# Patient Record
Sex: Male | Born: 1945 | Race: White | Hispanic: No | Marital: Single | Smoking: Current every day smoker
Health system: Southern US, Community
[De-identification: ages and names within clinical notes are randomized; demographics above are authoritative.]

## PROBLEM LIST (undated history)

## (undated) DIAGNOSIS — R42 Dizziness and giddiness: Secondary | ICD-10-CM

## (undated) DIAGNOSIS — I6529 Occlusion and stenosis of unspecified carotid artery: Secondary | ICD-10-CM

## (undated) DIAGNOSIS — E7800 Pure hypercholesterolemia, unspecified: Secondary | ICD-10-CM

## (undated) DIAGNOSIS — I10 Essential (primary) hypertension: Secondary | ICD-10-CM

## (undated) DIAGNOSIS — I639 Cerebral infarction, unspecified: Secondary | ICD-10-CM

## (undated) HISTORY — DX: Occlusion and stenosis of unspecified carotid artery: I65.29

## (undated) HISTORY — DX: Cerebral infarction, unspecified: I63.9

---

## 1997-09-08 ENCOUNTER — Encounter: Admission: RE | Admit: 1997-09-08 | Discharge: 1997-09-08 | Payer: Self-pay | Admitting: Sports Medicine

## 1997-09-10 ENCOUNTER — Encounter: Admission: RE | Admit: 1997-09-10 | Discharge: 1997-09-10 | Payer: Self-pay | Admitting: Family Medicine

## 1997-12-08 ENCOUNTER — Encounter: Admission: RE | Admit: 1997-12-08 | Discharge: 1997-12-08 | Payer: Self-pay | Admitting: Family Medicine

## 1997-12-24 ENCOUNTER — Encounter: Admission: RE | Admit: 1997-12-24 | Discharge: 1997-12-24 | Payer: Self-pay | Admitting: Family Medicine

## 1998-01-05 ENCOUNTER — Encounter: Admission: RE | Admit: 1998-01-05 | Discharge: 1998-01-05 | Payer: Self-pay | Admitting: Family Medicine

## 1998-02-21 ENCOUNTER — Encounter: Admission: RE | Admit: 1998-02-21 | Discharge: 1998-02-21 | Payer: Self-pay | Admitting: Family Medicine

## 1998-04-07 ENCOUNTER — Encounter: Admission: RE | Admit: 1998-04-07 | Discharge: 1998-04-07 | Payer: Self-pay | Admitting: Family Medicine

## 1998-10-03 ENCOUNTER — Ambulatory Visit (HOSPITAL_COMMUNITY): Admission: RE | Admit: 1998-10-03 | Discharge: 1998-10-03 | Payer: Self-pay | Admitting: *Deleted

## 2000-10-25 ENCOUNTER — Ambulatory Visit (HOSPITAL_COMMUNITY): Admission: RE | Admit: 2000-10-25 | Discharge: 2000-10-25 | Payer: Self-pay | Admitting: Endocrinology

## 2000-10-26 ENCOUNTER — Encounter: Payer: Self-pay | Admitting: Internal Medicine

## 2003-06-05 DIAGNOSIS — I639 Cerebral infarction, unspecified: Secondary | ICD-10-CM

## 2003-06-05 HISTORY — PX: CAROTID ENDARTERECTOMY: SUR193

## 2003-06-05 HISTORY — DX: Cerebral infarction, unspecified: I63.9

## 2004-04-24 ENCOUNTER — Emergency Department (HOSPITAL_COMMUNITY): Admission: EM | Admit: 2004-04-24 | Discharge: 2004-04-24 | Payer: Self-pay | Admitting: Emergency Medicine

## 2007-06-19 ENCOUNTER — Emergency Department (HOSPITAL_COMMUNITY): Admission: EM | Admit: 2007-06-19 | Discharge: 2007-06-19 | Payer: Self-pay | Admitting: Emergency Medicine

## 2008-05-29 ENCOUNTER — Inpatient Hospital Stay: Payer: Self-pay | Admitting: Internal Medicine

## 2008-06-09 ENCOUNTER — Encounter (HOSPITAL_COMMUNITY): Admission: RE | Admit: 2008-06-09 | Discharge: 2008-07-09 | Payer: Self-pay | Admitting: Internal Medicine

## 2008-06-24 ENCOUNTER — Ambulatory Visit: Payer: Self-pay | Admitting: Vascular Surgery

## 2008-07-20 ENCOUNTER — Ambulatory Visit: Payer: Self-pay | Admitting: Vascular Surgery

## 2008-07-30 ENCOUNTER — Encounter: Payer: Self-pay | Admitting: Vascular Surgery

## 2008-07-30 ENCOUNTER — Ambulatory Visit: Payer: Self-pay | Admitting: Vascular Surgery

## 2008-07-30 ENCOUNTER — Inpatient Hospital Stay (HOSPITAL_COMMUNITY): Admission: RE | Admit: 2008-07-30 | Discharge: 2008-07-31 | Payer: Self-pay | Admitting: Vascular Surgery

## 2008-07-30 HISTORY — PX: CAROTID ENDARTERECTOMY: SUR193

## 2008-08-17 ENCOUNTER — Ambulatory Visit: Payer: Self-pay | Admitting: Vascular Surgery

## 2009-03-22 ENCOUNTER — Ambulatory Visit: Payer: Self-pay | Admitting: Vascular Surgery

## 2009-09-13 ENCOUNTER — Ambulatory Visit: Payer: Self-pay | Admitting: Vascular Surgery

## 2009-09-28 ENCOUNTER — Emergency Department (HOSPITAL_COMMUNITY): Admission: EM | Admit: 2009-09-28 | Discharge: 2009-09-28 | Payer: Self-pay | Admitting: Emergency Medicine

## 2010-08-22 LAB — DIFFERENTIAL
Basophils Absolute: 0 10*3/uL (ref 0.0–0.1)
Basophils Relative: 0 % (ref 0–1)
Eosinophils Absolute: 0.1 10*3/uL (ref 0.0–0.7)
Eosinophils Relative: 1 % (ref 0–5)
Monocytes Absolute: 0.8 10*3/uL (ref 0.1–1.0)

## 2010-08-22 LAB — CBC
HCT: 39.5 % (ref 39.0–52.0)
Hemoglobin: 13.9 g/dL (ref 13.0–17.0)
MCHC: 35.3 g/dL (ref 30.0–36.0)
MCV: 94.1 fL (ref 78.0–100.0)
RDW: 14.4 % (ref 11.5–15.5)

## 2010-08-22 LAB — BASIC METABOLIC PANEL
CO2: 26 mEq/L (ref 19–32)
Calcium: 8.9 mg/dL (ref 8.4–10.5)
Glucose, Bld: 117 mg/dL — ABNORMAL HIGH (ref 70–99)
Potassium: 3.8 mEq/L (ref 3.5–5.1)
Sodium: 137 mEq/L (ref 135–145)

## 2010-08-22 LAB — POCT CARDIAC MARKERS
CKMB, poc: 1.1 ng/mL (ref 1.0–8.0)
Troponin i, poc: <0.05 ng/mL (ref 0.00–0.09)

## 2010-08-22 LAB — CK TOTAL AND CKMB (NOT AT ARMC)
CK, MB: 1.6 ng/mL (ref 0.3–4.0)
Total CK: 41 U/L (ref 7–232)

## 2010-08-27 ENCOUNTER — Emergency Department (HOSPITAL_COMMUNITY)
Admission: EM | Admit: 2010-08-27 | Discharge: 2010-08-27 | Disposition: A | Discharge status: Home / Self Care | Payer: Medicare Other | Attending: Emergency Medicine | Admitting: Emergency Medicine

## 2010-08-27 ENCOUNTER — Emergency Department (HOSPITAL_COMMUNITY): Payer: Medicare Other

## 2010-08-27 DIAGNOSIS — I10 Essential (primary) hypertension: Secondary | ICD-10-CM | POA: Insufficient documentation

## 2010-08-27 DIAGNOSIS — R42 Dizziness and giddiness: Secondary | ICD-10-CM | POA: Insufficient documentation

## 2010-08-27 DIAGNOSIS — R51 Headache: Secondary | ICD-10-CM | POA: Insufficient documentation

## 2010-08-27 LAB — BASIC METABOLIC PANEL
Calcium: 9.1 mg/dL (ref 8.4–10.5)
Creatinine, Ser: 1.21 mg/dL (ref 0.4–1.5)
GFR calc non Af Amer: >60 mL/min (ref >60)
Glucose, Bld: 100 mg/dL — ABNORMAL HIGH (ref 70–99)
Sodium: 141 mEq/L (ref 135–145)

## 2010-08-27 LAB — DIFFERENTIAL
Basophils Absolute: 0 10*3/uL (ref 0.0–0.1)
Basophils Relative: 0 % (ref 0–1)
Eosinophils Relative: 1 % (ref 0–5)
Lymphocytes Relative: 22 % (ref 12–46)
Monocytes Absolute: 0.6 10*3/uL (ref 0.1–1.0)

## 2010-08-27 LAB — CBC
HCT: 43.4 % (ref 39.0–52.0)
MCH: 32.2 pg (ref 26.0–34.0)
MCHC: 33.6 g/dL (ref 30.0–36.0)
RDW: 14 % (ref 11.5–15.5)

## 2010-09-04 ENCOUNTER — Emergency Department (HOSPITAL_COMMUNITY)
Admission: EM | Admit: 2010-09-04 | Discharge: 2010-09-04 | Discharge status: Against Medical Advice | Payer: Medicare Other | Attending: Emergency Medicine | Admitting: Emergency Medicine

## 2010-09-05 ENCOUNTER — Emergency Department (HOSPITAL_COMMUNITY)
Admission: EM | Admit: 2010-09-05 | Discharge: 2010-09-05 | Disposition: A | Discharge status: Home / Self Care | Payer: Medicare Other | Attending: Emergency Medicine | Admitting: Emergency Medicine

## 2010-09-05 DIAGNOSIS — Z8673 Personal history of transient ischemic attack (TIA), and cerebral infarction without residual deficits: Secondary | ICD-10-CM | POA: Insufficient documentation

## 2010-09-05 DIAGNOSIS — I10 Essential (primary) hypertension: Secondary | ICD-10-CM | POA: Insufficient documentation

## 2010-09-05 DIAGNOSIS — S90569A Insect bite (nonvenomous), unspecified ankle, initial encounter: Secondary | ICD-10-CM | POA: Insufficient documentation

## 2010-09-05 DIAGNOSIS — Z79899 Other long term (current) drug therapy: Secondary | ICD-10-CM | POA: Insufficient documentation

## 2010-09-05 DIAGNOSIS — W57XXXA Bitten or stung by nonvenomous insect and other nonvenomous arthropods, initial encounter: Secondary | ICD-10-CM | POA: Insufficient documentation

## 2010-09-19 LAB — CBC
HCT: 39.2 % (ref 39.0–52.0)
Hemoglobin: 13.7 g/dL (ref 13.0–17.0)
Hemoglobin: 16.1 g/dL (ref 13.0–17.0)
MCHC: 35 g/dL (ref 30.0–36.0)
MCV: 94.3 fL (ref 78.0–100.0)
Platelets: 150 K/uL (ref 150–400)
Platelets: 193 10*3/uL (ref 150–400)
RBC: 4.16 MIL/uL — ABNORMAL LOW (ref 4.22–5.81)
RDW: 13.3 % (ref 11.5–15.5)
RDW: 14 % (ref 11.5–15.5)
WBC: 11.2 K/uL — ABNORMAL HIGH (ref 4.0–10.5)

## 2010-09-19 LAB — BASIC METABOLIC PANEL WITH GFR
BUN: 12 mg/dL (ref 6–23)
CO2: 26 meq/L (ref 19–32)
Calcium: 8.3 mg/dL — ABNORMAL LOW (ref 8.4–10.5)
Chloride: 103 meq/L (ref 96–112)
Creatinine, Ser: 0.95 mg/dL (ref 0.4–1.5)
GFR calc non Af Amer: >60 mL/min
Glucose, Bld: 112 mg/dL — ABNORMAL HIGH (ref 70–99)
Potassium: 3.7 meq/L (ref 3.5–5.1)
Sodium: 133 meq/L — ABNORMAL LOW (ref 135–145)

## 2010-09-19 LAB — URINALYSIS, ROUTINE W REFLEX MICROSCOPIC
Glucose, UA: NEGATIVE mg/dL
Hgb urine dipstick: NEGATIVE
Specific Gravity, Urine: 1.01 (ref 1.005–1.030)
pH: 5.5 (ref 5.0–8.0)

## 2010-09-19 LAB — COMPREHENSIVE METABOLIC PANEL WITH GFR
ALT: 18 U/L (ref 0–53)
AST: 17 U/L (ref 0–37)
Albumin: 3.6 g/dL (ref 3.5–5.2)
Alkaline Phosphatase: 81 U/L (ref 39–117)
BUN: 10 mg/dL (ref 6–23)
CO2: 28 meq/L (ref 19–32)
Calcium: 9.1 mg/dL (ref 8.4–10.5)
Chloride: 104 meq/L (ref 96–112)
Creatinine, Ser: 0.87 mg/dL (ref 0.4–1.5)
GFR calc non Af Amer: >60 mL/min
Glucose, Bld: 113 mg/dL — ABNORMAL HIGH (ref 70–99)
Potassium: 4.6 meq/L (ref 3.5–5.1)
Sodium: 137 meq/L (ref 135–145)
Total Bilirubin: 0.7 mg/dL (ref 0.3–1.2)
Total Protein: 6.3 g/dL (ref 6.0–8.3)

## 2010-09-19 LAB — CROSSMATCH
ABO/RH(D): O POS
Antibody Screen: NEGATIVE

## 2010-09-19 LAB — ABO/RH: ABO/RH(D): O POS

## 2010-09-20 ENCOUNTER — Emergency Department (HOSPITAL_COMMUNITY)
Admission: EM | Admit: 2010-09-20 | Discharge: 2010-09-20 | Disposition: A | Discharge status: Home / Self Care | Payer: Medicare Other | Attending: Emergency Medicine | Admitting: Emergency Medicine

## 2010-09-20 DIAGNOSIS — Z79899 Other long term (current) drug therapy: Secondary | ICD-10-CM | POA: Insufficient documentation

## 2010-09-20 DIAGNOSIS — Z8673 Personal history of transient ischemic attack (TIA), and cerebral infarction without residual deficits: Secondary | ICD-10-CM | POA: Insufficient documentation

## 2010-09-20 DIAGNOSIS — Z7901 Long term (current) use of anticoagulants: Secondary | ICD-10-CM | POA: Insufficient documentation

## 2010-09-20 DIAGNOSIS — R21 Rash and other nonspecific skin eruption: Secondary | ICD-10-CM | POA: Insufficient documentation

## 2010-09-20 DIAGNOSIS — I10 Essential (primary) hypertension: Secondary | ICD-10-CM | POA: Insufficient documentation

## 2010-09-23 ENCOUNTER — Emergency Department (HOSPITAL_COMMUNITY)
Admission: EM | Admit: 2010-09-23 | Discharge: 2010-09-23 | Disposition: A | Discharge status: Home / Self Care | Payer: Medicare Other | Attending: Emergency Medicine | Admitting: Emergency Medicine

## 2010-09-23 DIAGNOSIS — I10 Essential (primary) hypertension: Secondary | ICD-10-CM | POA: Insufficient documentation

## 2010-09-23 DIAGNOSIS — L259 Unspecified contact dermatitis, unspecified cause: Secondary | ICD-10-CM | POA: Insufficient documentation

## 2010-09-23 DIAGNOSIS — Z8673 Personal history of transient ischemic attack (TIA), and cerebral infarction without residual deficits: Secondary | ICD-10-CM | POA: Insufficient documentation

## 2010-09-23 DIAGNOSIS — Z7901 Long term (current) use of anticoagulants: Secondary | ICD-10-CM | POA: Insufficient documentation

## 2010-09-26 ENCOUNTER — Other Ambulatory Visit: Payer: Self-pay

## 2010-10-17 NOTE — Procedures (Signed)
CAROTID DUPLEX EXAM   INDICATION:  Follow up carotid artery disease.   HISTORY:  Diabetes:  No.  Cardiac:  No.  Hypertension:  Yes.  Smoking:  No.  Previous Surgery:  Left carotid endarterectomy on 07/30/2008.  CV History:  CVA in 2005.  Amaurosis Fugax No, Paresthesias No, Hemiparesis No.                                       RIGHT             LEFT  Brachial systolic pressure:         100               96  Brachial Doppler waveforms:         WNL               WNL  Vertebral direction of flow:        Antegrade         Antegrade  DUPLEX VELOCITIES (cm/sec)  CCA peak systolic                   94                98  ECA peak systolic                   131               89  ICA peak systolic                   78                82  ICA end diastolic                   26                33  PLAQUE MORPHOLOGY:                  Heterogenous  PLAQUE AMOUNT:                      Mild  PLAQUE LOCATION:                    ICA   IMPRESSION:  1. Right internal carotid artery suggests 20% to 39% stenosis.  2. Left internal carotid artery appears patent, status post left      carotid endarterectomy with no stenosis noted.  3. Antegrade flow in bilateral vertebrals.   ___________________________________________  Quita Skye Hart Rochester, M.D.   CB/MEDQ  D:  09/13/2009  T:  09/14/2009  Job:  [id number]

## 2010-10-17 NOTE — Procedures (Signed)
CAROTID DUPLEX EXAM   INDICATION:  Follow up carotid artery disease, near-occlusion, left ICA,  per CT.   HISTORY:  Diabetes:  No.  Cardiac:  No.  Hypertension:  Yes.  Smoking:  Yes.  Previous Surgery:  CV History:  CVA in 12/09.  Amaurosis Fugax Yes, Paresthesias Yes, Hemiparesis Yes.                                       RIGHT             LEFT  Brachial systolic pressure:         116               118  Brachial Doppler waveforms:         WNL               WNL  Vertebral direction of flow:        Antegrade         Antegrade  DUPLEX VELOCITIES (cm/sec)  CCA peak systolic                   100               80  ECA peak systolic                   181               135  ICA peak systolic                   85                562  ICA end diastolic                   26                187  PLAQUE MORPHOLOGY:                  Calcified         Heterogenous  PLAQUE AMOUNT:                      Mild              Severe  PLAQUE LOCATION:                    ICA/ECA  ICA/ECA/Bifurcation   IMPRESSION:  1. Right internal carotid artery shows evidence of 20-39% stenosis.  2. Left internal carotid artery shows evidence of 80-99% stenosis.  3. Right external carotid artery stenosis.   ___________________________________________  Quita Skye Hart Rochester, M.D.   AS/MEDQ  D:  07/20/2008  T:  07/20/2008  Job:  [id number]

## 2010-10-17 NOTE — Assessment & Plan Note (Signed)
OFFICE VISIT   Frederick [name], Frederick [name]  DOB:  13-Jun-1945                                       08/17/2008  UEAVW#:[id number]   The patient returns for initial followup following left carotid  endarterectomy done February 26 for severe left internal carotid  stenosis.  He suffered a left brain stroke in December of 2009 with some  right-sided weakness and aphasia most of which are resolved.  He  continues to do well following the surgery with no neurologic complaints  or complications.  He continues to have some mild clumsiness in the  right side and his speech is slightly slow but fairly clear.  He  continues to take Coumadin 2.5 mg per day followed by Dr. Concepcion Elk.   PHYSICAL EXAMINATION:  On examination blood pressure 105/70, heart rate  60, respirations 14.  Left neck incision has healed nicely.  Carotid  pulses 3+ with no audible bruit.  Neurologic exam is as described above  with very mild clumsiness on the right side.   In general I think he is doing well.  He will continue to increase his  activity as tolerated and return to see me in 6 months with followup  carotid duplex exam at that time unless he develops any new neurologic  symptoms in the interim.   Quita Skye Hart Rochester, M.D.  Electronically Signed   JDL/MEDQ  D:  08/17/2008  T:  08/19/2008  Job:  [id number]   cc:   Fleet Contras, M.D.

## 2010-10-17 NOTE — Op Note (Signed)
NAME:  Frederick [name], Frederick [name]                  ACCOUNT NO.:  [id number]   MEDICAL RECORD NO.:  [id number]          PATIENT TYPE:  INP   LOCATION:  3314                         FACILITY:  [facility]   PHYSICIAN:  Quita Skye. Hart Rochester, M.D.  DATE OF BIRTH:  1946-04-06   DATE OF PROCEDURE:  07/30/2008  DATE OF DISCHARGE:                               OPERATIVE REPORT   PREOPERATIVE DIAGNOSIS:  Severe left internal carotid stenosis, status  post left brain cerebrovascular accident.   POSTOPERATIVE DIAGNOSIS:  Severe left internal carotid stenosis, status  post left brain cerebrovascular accident.   OPERATION:  Left carotid endarterectomy with Dacron patch angioplasty.   SURGEON:  Quita Skye. Hart Rochester, MD   FIRST ASSISTANT:  Nurse.   ANESTHESIA:  General endotracheal.   BRIEF HISTORY:  This patient suffered a left brain stroke in December  2009, consisting of a right-sided weakness and aphasia.  This is almost  completely resolved with the exception of some mild clumsiness on the  right side, and it was found to have a 95% focal left internal carotid  stenosis by CT angiogram, confirmed by duplex scanning.  He has mild  disease on the contralateral right side, and scheduled now for left  carotid endarterectomy.   PROCEDURE:  The patient was taken to the operating room and placed in  the supine position at which time satisfactory general endotracheal  anesthesia was administered.  Left neck was prepped with Betadine and  scrub solution and draped in routine sterile manner.  Incision was made  along the anterior border of the sternocleidomastoid muscle and carried  down through subcutaneous tissue and platysma using the Bovie.  Common  facial vein and external jugular veins were ligated with 3-0 silk ties  and divided exposing the common internal and external carotid arteries.  Care was taken not to injure the vagus or hypoglossal nerves, both of  which were exposed.  There was calcified atherosclerotic  plaque at the  carotid bifurcation extending up the internal carotid artery about 3-4  cm up to the crossing of the hypoglossal nerve, which was mobilized for  exposure.  A care was taken not to injure the nerve during this  exposure.  Common carotid artery was relatively small vessel, but  relatively free of disease.  A #10 shunt was prepared, and the patient  was heparinized.  Carotid vessels were occluded with vascular clamps.  Longitudinal opening made in the common carotid with 15 blade and  extended up the internal carotid with Potts scissors to a point distal  to the disease.  Plaque was severely stenotic at least 90-95% with good  backbleeding coming from the distal vessel.  A #10 shunt was inserted  without difficulty reestablishing flow in about 2 minutes.  Standard  endarterectomy was then performed using elevator and Potts scissors with  an eversion endarterectomy of the external carotid.  The plaque  feathered off distal internal carotid artery nicely, not requiring any  tacking sutures.  Lumen was thoroughly irrigated with heparin saline.  All loose debris carefully removed.  Arteriotomy was closed with a patch  using continuous 6-0 Prolene.  Prior to completion of the closure, shunt  was removed and after 30 minutes of shunt time following antegrade and  retrograde flushing, closure was completed, reestablishing the flow  initially up the external and up the internal branch.  Carotid was  occluded for less than 2 minutes removal of the  shunt.  Protamine was then given to reverse the heparin following  adequate hemostasis.  The wound was irrigated with saline and closed in  layers with Vicryl in a subcuticular fashion.  Sterile dressing was  applied.  The patient was taken to recovery room in satisfactory  condition.      Quita Skye Hart Rochester, M.D.  Electronically Signed     JDL/MEDQ  D:  07/30/2008  T:  07/31/2008  Job:  [id number]

## 2010-10-17 NOTE — Discharge Summary (Signed)
NAME:  Frederick [name], Frederick [name]                  ACCOUNT NO.:  [id number]   MEDICAL RECORD NO.:  [id number]          PATIENT TYPE:  INP   LOCATION:  3314                         FACILITY:  [facility]   PHYSICIAN:  Quita Skye. Hart Rochester, M.D.  DATE OF BIRTH:  11-Jun-1945   DATE OF ADMISSION:  07/30/2008  DATE OF DISCHARGE:  07/31/2008                               DISCHARGE SUMMARY   FINAL DISCHARGE DIAGNOSES:  1. Severe left carotid occlusive disease, symptomatic.  2. Hypertension.  3. Diabetes mellitus.  4. Hyperlipidemia.  5. 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   PROCEDURE PERFORMED:  July 30, 2008, left carotid endarterectomy  with Dacron patch angioplasty closure.   COMPLICATIONS:  None.   CONDITION ON DISCHARGE:  Stable, improving.   DISCHARGE MEDICATIONS:  1. Coumadin 2.5 mg p.o. daily.  2. Enalapril 5 mg p.o. daily.  3. Amlodipine 10 mg p.o. daily.  4. Aspirin 81 mg p.o. daily.  5. Atenolol 50 mg p.o. daily.  6. Pravastatin 40 mg p.o. daily.  7. Percocet 5/325 one p.o. q.4 h. p.r.n. pain.   DISPOSITION:  He is discharged home in stable condition with his wounds  healing well.  He is given careful instructions regarding care of his  wounds and activity level.  He is given a written appointment to see Dr.  Hart Rochester in 2 weeks.  The office will make the appointment.   Brief identifying statement with complete details, please refer the  typed history and physical.  Briefly, this 65 year old gentleman had a  left brain CVA.  This was on May 31, 2008.  He was found at that  time to have a severe left internal carotid artery narrowing.  Dr.  Hart Rochester evaluated him and recommended left carotid endarterectomy for  stroke prevention.  He was informed of the risks and benefits of the  procedure and after careful consideration, elected to proceed with  surgery.   HOSPITAL COURSE:  Preoperative workup was completed as an outpatient.  He was brought in  through same-day surgery and underwent the  aforementioned left carotid endarterectomy.  For complete details,  please refer the typed operative report.  The procedure was without  complications.  He was transferred to a bed in the post anesthesia care  unit extubated.  Following stabilization, he was then transferred to a  bed in a surgical step-down unit.  He was observed overnight.  The  following morning, he was found to be stable.  His neurological status  was intact.  His wound was healing well.  He was felt stable and was  discharged home in stable condition.      Wilmon Arms, PA      Quita Skye Hart Rochester, M.D.  Electronically Signed    KEL/MEDQ  D:  08/04/2008  T:  08/05/2008  Job:  [id number]

## 2010-10-17 NOTE — Assessment & Plan Note (Signed)
OFFICE VISIT   Frederick [name], Frederick [name]  DOB:  26-May-1946                                       03/22/2009  ZOXWR#:[id number]   The patient returns for followup regarding his carotid occlusive  disease.  He has had no new neurologic symptoms including hemiparesis,  aphasia, amaurosis fugax, diplopia, blurred vision, syncope or recent  stroke.  He does have some chronic right-sided clumsiness which is mild  from his left brain stroke and has some mild weakness on the left side  as well that is chronic.  He has three chronic stable conditions which  include hypertension, hyperlipidemia and his cerebrovascular disease as  well as a history of DVT in both legs.  He has type 2 diabetes mellitus.   REVIEW OF SYSTEMS:  He denies any chest pain, dyspnea on exertion, PND,  orthopnea.  No lower extremity claudication symptoms.  Denies any GI or  GU symptoms.  All else negative.   SOCIAL HISTORY:  He has quit smoking and is currently taking Chantix  which is giving him some GI upset but other than that seems to  tolerating it adequately.   PHYSICAL EXAM:  Blood pressure is 97/65, heart rate 52, respirations 14,  O2 sats 100%.  He is alert and oriented x3.  HEENT exam is unremarkable.  Neck is supple, 3+ carotid pulses palpable.  No bruits are audible.  Neurologic exam reveals some mild right-sided weakness and clumsiness.  Chest is clear to auscultation.  Cardiovascular exam reveals a regular  rhythm, no murmurs.  No skin rashes are noted.  Abdomen is soft,  nontender with no masses.  There is no adenopathy in the neck.   Carotid duplex exam was ordered by me today and reviewed and interpreted  and he has no evidence of flow reduction on the operative side on the  left.  The right internal carotid has an approximately 40% stenosis.   In general I think he is getting along well.  We will continue to follow  him on a chronic basis with repeat carotid duplex exam to be ordered  for  6 months from now and will then return on a protocol on an annual basis.  If he has any new neurologic symptoms he will be in touch with [place].   Quita Skye Hart Rochester, M.D.  Electronically Signed   JDL/MEDQ  D:  03/22/2009  T:  03/23/2009  Job:  [id number]   cc:   Fleet Contras, M.D.
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
## 2010-10-17 NOTE — Procedures (Signed)
CAROTID DUPLEX EXAM   INDICATION:  Left carotid endarterectomy.   HISTORY:  Diabetes:  No.  Cardiac:  No.  Hypertension:  Yes.  Smoking:  Yes.  Previous Surgery:  Left carotid endarterectomy on 07/30/2008.  CV History:  CVA in 2009.  Amaurosis Fugax No, Paresthesias No, Hemiparesis No                                       RIGHT             LEFT  Brachial systolic pressure:         106               108  Brachial Doppler waveforms:         Normal            Normal  Vertebral direction of flow:        Antegrade         Antegrade  DUPLEX VELOCITIES (cm/sec)  CCA peak systolic                   81                67  ECA peak systolic                   127               94  ICA peak systolic                   51                86  ICA end diastolic                   14                18  PLAQUE MORPHOLOGY:                  Mixed  PLAQUE AMOUNT:                      Mild              None  PLAQUE LOCATION:                    ICA/ECA   IMPRESSION:  1. 1%-39% stenosis of the right internal carotid artery.  2. Patent left carotid endarterectomy site with no left internal      carotid artery stenosis.  3. Improvement of the left internal carotid artery noted when compared      to the previous exam on 07/20/2008 with the right internal carotid      artery remaining stable.   ___________________________________________  Quita Skye. Hart Rochester, M.D.   CH/MEDQ  D:  03/23/2009  T:  03/23/2009  Job:  [id number]

## 2010-11-22 ENCOUNTER — Other Ambulatory Visit (INDEPENDENT_AMBULATORY_CARE_PROVIDER_SITE_OTHER): Payer: Medicare Other

## 2010-11-22 DIAGNOSIS — Z48812 Encounter for surgical aftercare following surgery on the circulatory system: Secondary | ICD-10-CM

## 2010-11-22 DIAGNOSIS — I6529 Occlusion and stenosis of unspecified carotid artery: Secondary | ICD-10-CM

## 2010-12-01 NOTE — Procedures (Unsigned)
CAROTID DUPLEX EXAM  INDICATION:  Follow up left CEA, 07/30/2008.  HISTORY: Diabetes:  No. Cardiac:  No. Hypertension:  Yes. Smoking:  Yes. Previous Surgery:  Left CEA. CV History:  CVA in 2005. Amaurosis Fugax No, Paresthesias No, Hemiparesis No.                                      RIGHT             LEFT Brachial systolic pressure:         85                90 Brachial Doppler waveforms:         WNL               WNL Vertebral direction of flow:        Antegrade         Antegrade DUPLEX VELOCITIES (cm/sec) CCA peak systolic                   81                75 ECA peak systolic                   140               83 ICA peak systolic                   58                62 ICA end diastolic                   18                27 PLAQUE MORPHOLOGY:                  Heterogenous PLAQUE AMOUNT:                      Mild              N/A PLAQUE LOCATION:                    CCA/ECA/ICA  IMPRESSION: 1. 1% to 39% right internal carotid artery plaquing. 2. Widely patent left carotid endarterectomy without evidence of     restenosis or hyperplasia. 3. Bilateral vertebral arteries are within normal limits. 4. Stable findings when compared to previous examination.  ___________________________________________ Quita Skye Hart Rochester, M.D.  LT/MEDQ  D:  11/22/2010  T:  11/23/2010  Job:  [id number]

## 2011-02-23 LAB — PROTIME-INR
INR: 2 — ABNORMAL HIGH
Prothrombin Time: 23.3 — ABNORMAL HIGH

## 2011-03-21 ENCOUNTER — Emergency Department (HOSPITAL_COMMUNITY)
Admission: EM | Admit: 2011-03-21 | Discharge: 2011-03-21 | Discharge status: Against Medical Advice | Payer: Medicare Other | Attending: Emergency Medicine | Admitting: Emergency Medicine

## 2011-03-21 DIAGNOSIS — Z5320 Procedure and treatment not carried out because of patient's decision for unspecified reasons: Secondary | ICD-10-CM | POA: Insufficient documentation

## 2011-03-21 DIAGNOSIS — Z760 Encounter for issue of repeat prescription: Secondary | ICD-10-CM | POA: Insufficient documentation

## 2011-03-21 NOTE — ED Notes (Signed)
Patient walked into the hallway, verbally abusive to staff with regards to having to wait to be seen.  Patient stated he will come back tomorrow to receive a prescription for tobacco cessation medication.  Pt advised that he has a provider signed up to come in and see him; however, patient insisted on coming back tomorrow.  Re-attempted service recovery, but patient was insistent on leaving today to come back tomorrow.

## 2011-03-21 NOTE — ED Notes (Signed)
Pt awaiting eval by ED provider.  Pt has continued to be verbally abusive with every employee who he has come into contact with from the triage nurse to myself, and the pharmacy technician.  Attempted to provide service recovery opportunity while patient is in the ED, and I did not perceive patient to be receptive.

## 2011-03-21 NOTE — ED Notes (Signed)
Pt says is here because wants a prescription for a patch to help him stop smoking.

## 2011-03-22 ENCOUNTER — Encounter (HOSPITAL_COMMUNITY): Payer: Self-pay | Admitting: Emergency Medicine

## 2011-03-22 ENCOUNTER — Emergency Department (HOSPITAL_COMMUNITY)
Admission: EM | Admit: 2011-03-22 | Discharge: 2011-03-22 | Disposition: A | Discharge status: Home / Self Care | Payer: Medicare Other | Attending: Emergency Medicine | Admitting: Emergency Medicine

## 2011-03-22 DIAGNOSIS — J449 Chronic obstructive pulmonary disease, unspecified: Secondary | ICD-10-CM | POA: Insufficient documentation

## 2011-03-22 DIAGNOSIS — J4489 Other specified chronic obstructive pulmonary disease: Secondary | ICD-10-CM | POA: Insufficient documentation

## 2011-03-22 DIAGNOSIS — Z7901 Long term (current) use of anticoagulants: Secondary | ICD-10-CM | POA: Insufficient documentation

## 2011-03-22 DIAGNOSIS — F17200 Nicotine dependence, unspecified, uncomplicated: Secondary | ICD-10-CM | POA: Insufficient documentation

## 2011-03-22 DIAGNOSIS — Z720 Tobacco use: Secondary | ICD-10-CM

## 2011-03-22 NOTE — ED Notes (Signed)
Pt states he wants some pill to quit smoking

## 2011-03-22 NOTE — ED Provider Notes (Signed)
History     CSN: [id number] Arrival date & time: 03/22/2011 12:59 PM   None     Chief Complaint  Patient presents with  . Medication Refill    (Consider location/radiation/quality/duration/timing/severity/associated sxs/prior treatment) HPI Comments: Pt states he has been smoking for 30 years. He had a chest xray at the [facility] hospital in [place], [place] which revealed COPD and other changes. It was suggested that he stop smoking. He states he has tried different products but wants to be placed on patches. [facility] not [facility] and [facility] He request a Rx from ED for smoking patches. No hemoptosis, No high fever.   The history is provided by the patient.    History reviewed. No pertinent past medical history.  History reviewed. No pertinent past surgical history.  History reviewed. No pertinent family history.  History  Substance Use Topics  . Smoking status: Current Everyday Smoker -- 1.0 packs/day  . Smokeless tobacco: Not on file  . Alcohol Use: No      Review of Systems  Constitutional: Negative for activity change.       All ROS Neg except as noted in HPI  HENT: Negative for nosebleeds and neck pain.   Eyes: Negative for photophobia and discharge.  Respiratory: Negative for cough, shortness of breath and wheezing.   Cardiovascular: Negative for chest pain and palpitations.  Gastrointestinal: Negative for abdominal pain and blood in stool.  Genitourinary: Negative for dysuria, frequency and hematuria.  Musculoskeletal: Negative for back pain and arthralgias.  Skin: Negative.   Neurological: Negative for dizziness, seizures and speech difficulty.  Psychiatric/Behavioral: Negative for hallucinations and confusion.    Allergies  Penicillins  Home Medications   Current Outpatient Rx  Name Route Sig Dispense Refill  . AMLODIPINE BESYLATE 10 MG PO TABS Oral Take 10 mg by mouth daily.      . ATENOLOL 50 MG PO TABS Oral Take  50 mg by mouth daily.      . ENALAPRIL MALEATE 5 MG PO TABS Oral Take 5 mg by mouth daily.      Marland Kitchen PRAVASTATIN SODIUM 40 MG PO TABS Oral Take 40 mg by mouth daily.      . WAL-ACT PO Oral Take 1 tablet by mouth 2 (two) times daily.      . WARFARIN SODIUM 2.5 MG PO TABS Oral Take 2.5 mg by mouth daily.        BP 112/63  Pulse 79  Temp(Src) 97.9 F (36.6 C) (Oral)  Resp 18  Ht 5\' 10"  (1.778 m)  Wt 140 lb (63.504 kg)  BMI 20.09 kg/m2  SpO2 100%  Physical Exam  Nursing note and vitals reviewed. Constitutional: He is oriented to person, place, and time. He appears well-developed and well-nourished.  Non-toxic appearance.  HENT:  Head: Normocephalic.  Right Ear: Tympanic membrane and external ear normal.  Left Ear: Tympanic membrane and external ear normal.  Eyes: EOM and lids are normal. Pupils are equal, round, and reactive to light.  Neck: Normal range of motion. Neck supple. Carotid bruit is not present.  Cardiovascular: Normal rate, regular rhythm, normal heart sounds, intact distal pulses and normal pulses.   Pulmonary/Chest: No respiratory distress. He has rhonchi.  Abdominal: Soft. Bowel sounds are normal. There is no tenderness. There is no guarding.  Musculoskeletal: Normal range of motion.  Lymphadenopathy:       Head (right side): No submandibular adenopathy present.  Head (left side): No submandibular adenopathy present.    He has no cervical adenopathy.  Neurological: He is alert and oriented to person, place, and time. He has normal strength. No cranial nerve deficit or sensory deficit.  Skin: Skin is warm and dry.  Psychiatric: He has a normal mood and affect. His speech is normal.    ED Course: Discussed need to obtain a primary MD for regular follow up and for assistance with smoking cessation.  Procedures (including critical care time)  Labs Reviewed - No data to display No results found.   Dx: tobacco abuse   MDM  I have reviewed nursing notes, vital  signs, and all appropriate lab and imaging results for this patient.        Kathie Dike, [place] 03/22/11 [phone]

## 2011-03-27 NOTE — ED Provider Notes (Signed)
Medical screening examination/treatment/procedure(s) were performed by non-physician practitioner and as supervising physician I was immediately available for consultation/collaboration.  Raeford Razor, MD 03/27/11 [phone]

## 2011-05-21 ENCOUNTER — Encounter (HOSPITAL_COMMUNITY): Payer: Self-pay

## 2011-05-21 ENCOUNTER — Emergency Department (HOSPITAL_COMMUNITY)
Admission: EM | Admit: 2011-05-21 | Discharge: 2011-05-21 | Disposition: A | Discharge status: Home / Self Care | Payer: Medicare Other | Attending: Emergency Medicine | Admitting: Emergency Medicine

## 2011-05-21 DIAGNOSIS — Z7901 Long term (current) use of anticoagulants: Secondary | ICD-10-CM | POA: Insufficient documentation

## 2011-05-21 DIAGNOSIS — Z79899 Other long term (current) drug therapy: Secondary | ICD-10-CM | POA: Insufficient documentation

## 2011-05-21 DIAGNOSIS — F17200 Nicotine dependence, unspecified, uncomplicated: Secondary | ICD-10-CM | POA: Insufficient documentation

## 2011-05-21 MED ORDER — BUPROPION HCL ER (SMOKING DET) 150 MG PO TB12: ORAL_TABLET | ORAL | Status: AC

## 2011-05-21 NOTE — ED Notes (Signed)
Pt says wants to get  A "shot" to help him quit smoking.  Says smoking is affecting his breathing.  Reports was put on nicotine patches last time he was here but is requesting something else.

## 2011-05-21 NOTE — ED Provider Notes (Signed)
1:36 PM  I performed a history and physical examination of Frederick [name] and discussed his management with Tammy Triplett.  I agree with the history, physical, assessment, and plan of care, with the following exceptions: None  The patient presents looking for help with smoking cessation. He is awake, alert, and oriented appropriately in no apparent distress. His lungs are clear to auscultation bilaterally and his heart sounds are normal with regular rate and rhythm.  I was present for the following procedures: None Time Spent in Critical Care of the patient: None Time spent in discussions with the patient and family: 5 minutes  Phylliss Strege D    Felisa Bonier, MD 05/21/11 [phone]

## 2011-05-23 NOTE — ED Provider Notes (Signed)
History     CSN: [id number] Arrival date & time: 05/21/2011 10:59 AM   First MD Initiated Contact with Patient 05/21/11 1308      Chief Complaint  Patient presents with  . Nicotine Dependence    (Consider location/radiation/quality/duration/timing/severity/associated sxs/prior treatment) HPI Comments: Patient comes to ED requesting a "shot" to help him quit smoking.  States he was told that a "shot" was available for nicotine dependence.  States he has tried multiple variations of nicotine patches, gum or "electronic cigarettes" w/o relief.  He states that he has been seen by his PMD for same and also tried Chantix w/o relief.  He denies any symptoms at this time.    Patient is a 65 y.o. male presenting with company of smokers. The history is provided by the patient.  Nicotine Dependence This is a chronic problem. The current episode started more than 1 month ago. The problem occurs constantly. The problem has been unchanged. Pertinent negatives include no abdominal pain, chest pain, coughing, diaphoresis, fever, myalgias, neck pain, numbness, rash, sore throat, vomiting or weakness. The symptoms are aggravated by nothing. Treatments tried: patient reports that he has tried nicotine patches, gum, chantix and electronic cigarettes all without success. The treatment provided no relief.    History reviewed. No pertinent past medical history.  History reviewed. No pertinent past surgical history.  No family history on file.  History  Substance Use Topics  . Smoking status: Current Everyday Smoker -- 1.0 packs/day  . Smokeless tobacco: Not on file  . Alcohol Use: No      Review of Systems  Constitutional: Negative for fever and diaphoresis.  HENT: Negative for sore throat, trouble swallowing, neck pain and neck stiffness.   Respiratory: Negative for cough and chest tightness.   Cardiovascular: Negative for chest pain.  Gastrointestinal: Negative for vomiting and abdominal pain.    Musculoskeletal: Negative for myalgias.  Skin: Negative for rash.  Neurological: Negative for dizziness, weakness and numbness.  Hematological: Does not bruise/bleed easily.  Psychiatric/Behavioral: Negative for confusion and decreased concentration.  All other systems reviewed and are negative.    Allergies  Penicillins  Home Medications   Current Outpatient Rx  Name Route Sig Dispense Refill  . AMLODIPINE BESYLATE 10 MG PO TABS Oral Take 10 mg by mouth daily.      . ATENOLOL 50 MG PO TABS Oral Take 50 mg by mouth daily.      . ENALAPRIL MALEATE 5 MG PO TABS Oral Take 5 mg by mouth daily.      Marland Kitchen PRAVASTATIN SODIUM 40 MG PO TABS Oral Take 40 mg by mouth daily.      . WAL-ACT PO Oral Take 1 tablet by mouth 2 (two) times daily.      . WARFARIN SODIUM 2.5 MG PO TABS Oral Take 2.5 mg by mouth daily.      . BUPROPION HCL ER (SMOKING DET) 150 MG PO TB12  One tab po qam for 3 days, then increase to one tab BID for 7 weeks 30 tablet 0    BP 112/59  Pulse 59  Temp(Src) 98 F (36.7 C) (Oral)  Resp 18  Ht 5\' 10"  (1.778 m)  Wt 140 lb (63.504 kg)  BMI 20.09 kg/m2  SpO2 100%  Physical Exam  Nursing note and vitals reviewed. Constitutional: He is oriented to person, place, and time. He appears well-developed and well-nourished. No distress.  HENT:  Head: Normocephalic and atraumatic.  Mouth/Throat: Oropharynx is clear and moist.  Neck:  Neck supple.  Cardiovascular: Normal rate, regular rhythm and normal heart sounds.   Pulmonary/Chest: Effort normal and breath sounds normal. No respiratory distress. He has no wheezes. He has no rales. He exhibits no tenderness.  Musculoskeletal: Normal range of motion.  Lymphadenopathy:    He has no cervical adenopathy.  Neurological: He is alert and oriented to person, place, and time. No cranial nerve deficit. He exhibits normal muscle tone. Coordination normal.  Skin: Skin is warm and dry.  Psychiatric: He has a normal mood and affect.    ED  Course  Procedures (including critical care time)  Labs Reviewed - No data to display No results found.   1. Nicotine addiction       MDM    Patient is alert, vitals are stable, he is well appearing.  I have discussed with him the importance of close follow-up with his PMD for further management of his nicotine dependence.  Will try bupropion.  I have also discussed the care plan of the pt with the EDP        Deion Forgue L. [place], [place] 05/23/11 2046

## 2011-05-25 NOTE — ED Provider Notes (Signed)
Evaluation and management procedures were performed by the PA/NP under my supervision/collaboration.   Felisa Bonier, MD 05/25/11 [phone]

## 2011-11-26 ENCOUNTER — Encounter: Payer: Self-pay | Admitting: Neurosurgery

## 2011-11-27 ENCOUNTER — Other Ambulatory Visit: Payer: Medicare Other

## 2011-11-27 ENCOUNTER — Ambulatory Visit: Payer: Medicare Other | Admitting: Neurosurgery

## 2011-11-27 ENCOUNTER — Ambulatory Visit: Payer: Medicare Other | Admitting: Vascular Surgery

## 2011-12-10 ENCOUNTER — Encounter: Payer: Self-pay | Admitting: Neurosurgery

## 2011-12-11 ENCOUNTER — Ambulatory Visit (INDEPENDENT_AMBULATORY_CARE_PROVIDER_SITE_OTHER): Payer: Medicare Other | Admitting: Neurosurgery

## 2011-12-11 ENCOUNTER — Ambulatory Visit (INDEPENDENT_AMBULATORY_CARE_PROVIDER_SITE_OTHER): Payer: Medicare Other | Admitting: *Deleted

## 2011-12-11 ENCOUNTER — Encounter: Payer: Self-pay | Admitting: Neurosurgery

## 2011-12-11 VITALS — BP 117/79 | HR 52 | Resp 14 | Ht 69.5 in | Wt 143.9 lb

## 2011-12-11 DIAGNOSIS — Z48812 Encounter for surgical aftercare following surgery on the circulatory system: Secondary | ICD-10-CM

## 2011-12-11 DIAGNOSIS — Z4889 Encounter for other specified surgical aftercare: Secondary | ICD-10-CM

## 2011-12-11 DIAGNOSIS — I6529 Occlusion and stenosis of unspecified carotid artery: Secondary | ICD-10-CM

## 2011-12-11 NOTE — Progress Notes (Signed)
[facility]  Carotid Office Note  CC: Annual carotid duplex for surveillance Referring Physician: Hart Rochester  History of Present Illness: 66 year old male patient of Dr. Hart Rochester who status post a left CEA February 2010. Patient has a history of a stroke in 2005 with minimal residual neural deficit. Patient reports no signs or symptoms of CVA, TIA, amaurosis fugax currently. The patient also denies any new medical diagnoses or recent surgeries.  Past Medical History  Diagnosis Date  . Carotid artery occlusion   . Stroke 2005    ROS: [x]  Positive   [ ]  Denies    General: [ ]  Weight loss, [ ]  Fever, [ ]  chills Neurologic: [ ]  Dizziness, [ ]  Blackouts, [ ]  Seizure [ ]  Stroke, [ ]  "Mini stroke", [ ]  Slurred speech, [ ]  Temporary blindness; [ ]  weakness in arms or legs, [ ]  Hoarseness Cardiac: [ ]  Chest pain/pressure, [ ]  Shortness of breath at rest [ ]  Shortness of breath with exertion, [ ]  Atrial fibrillation or irregular heartbeat Vascular: [ ]  Pain in legs with walking, [ ]  Pain in legs at rest, [ ]  Pain in legs at night,  [ ]  Non-healing ulcer, [ ]  Blood clot in vein/DVT,   Pulmonary: [ ]  Home oxygen, [ ]  Productive cough, [ ]  Coughing up blood, [ ]  Asthma,  [ ]  Wheezing Musculoskeletal:  [ ]  Arthritis, [ ]  Low back pain, [ ]  Joint pain Hematologic: [ ]  Easy Bruising, [ ]  Anemia; [ ]  Hepatitis Gastrointestinal: [ ]  Blood in stool, [ ]  Gastroesophageal Reflux/heartburn, [ ]  Trouble swallowing Urinary: [ ]  chronic Kidney disease, [ ]  on HD - [ ]  MWF or [ ]  TTHS, [ ]  Burning with urination, [ ]  Difficulty urinating Skin: [ ]  Rashes, [ ]  Wounds Psychological: [ ]  Anxiety, [ ]  Depression   Social History History  Substance Use Topics  . Smoking status: Current Everyday Smoker -- 1.0 packs/day  . Smokeless tobacco: Not on file  . Alcohol Use: No    Family History Family History  Problem Relation Age of Onset  . Diabetes Mother   . Heart disease Father   .  Heart attack Father     Allergies  Allergen Reactions  . Penicillins Nausea Only    Current Outpatient Prescriptions  Medication Sig Dispense Refill  . amLODipine (NORVASC) 10 MG tablet Take 10 mg by mouth daily.        Marland Kitchen atenolol (TENORMIN) 50 MG tablet Take 50 mg by mouth daily.        . enalapril (VASOTEC) 5 MG tablet Take 5 mg by mouth daily.        . pravastatin (PRAVACHOL) 40 MG tablet Take 40 mg by mouth daily.        . Triprolidine-Pseudoephedrine (WAL-ACT PO) Take 1 tablet by mouth 2 (two) times daily.        Marland Kitchen warfarin (COUMADIN) 2.5 MG tablet Take 2.5 mg by mouth daily.        Marland Kitchen buPROPion (ZYBAN) 150 MG 12 hr tablet One tab po qam for 3 days, then increase to one tab BID for 7 weeks  30 tablet  0    Physical Examination  Filed Vitals:   12/11/11 1550  BP: 117/79  Pulse: 52  Resp:     Body mass index is 20.95 kg/(m^2).  General:  WDWN in NAD Gait: Normal HEENT: WNL Eyes: Pupils equal Pulmonary: normal non-labored breathing , without Rales, rhonchi,  wheezing Cardiac: RRR, without  Murmurs, rubs or gallops; Abdomen: soft, NT, no masses Skin: no rashes, ulcers noted  Vascular Exam Pulses: 3+ radial pulses bilaterally Carotid bruits: Carotid pulses to auscultation no bruits are heard Extremities without ischemic changes, no Gangrene , no cellulitis; no open wounds;  Musculoskeletal: no muscle wasting or atrophy   Neurologic: A&O X 3; Appropriate Affect ; SENSATION: normal; MOTOR FUNCTION:  moving all extremities equally. Speech is fluent/normal  Non-Invasive Vascular Imaging CAROTID DUPLEX 12/11/2011  Right ICA 20 - 39 % stenosis Left ICA 0 - 19% stenosis   ASSESSMENT/PLAN: Asymptomatic patient status post left CEA in 2010. The patient will followup in one year with repeat carotid duplex. His questions were encouraged and answered, he is in agreement with this plan.  Lauree Chandler ANP   Clinic MD: Hart Rochester

## 2011-12-12 NOTE — Addendum Note (Signed)
Addended by: Sharee Pimple on: 12/12/2011 09:34 AM   Modules accepted: Orders

## 2011-12-19 NOTE — Procedures (Unsigned)
CAROTID DUPLEX EXAM  INDICATION:  Followup left carotid endarterectomy, 07/30/2008.  HISTORY: Diabetes:  No. Cardiac:  No. Hypertension:  Yes. Smoking:  Yes. Previous Surgery:  Left carotid endarterectomy. CV History:  CVA in 2005, asymptomatic today. Amaurosis Fugax No, Paresthesias No, Hemiparesis No                                      RIGHT             LEFT Brachial systolic pressure:         118               120 Brachial Doppler waveforms:         Triphasic         Biphasic Vertebral direction of flow:        Antegrade         Antegrade DUPLEX VELOCITIES (cm/sec) CCA peak systolic                   100               78 ECA peak systolic                   108               64 ICA peak systolic                   63                89 ICA end diastolic                   27                17 PLAQUE MORPHOLOGY:                  Heterogeneous, irregular            Homogeneous PLAQUE AMOUNT:                      Mild to moderate  Minimal PLAQUE LOCATION:                    Bifurcation ICA and ECA             Bifurcation ICA, CCAD  IMPRESSION:  A 1% to 39% right ICA stenosis.  Patent left carotid endarterectomy site with no evidence for re- stenosis.  Vertebral artery flow antegrade bilaterally.  ___________________________________________ Frederick [name] Rochester, M.D.  SS/MEDQ  D:  12/11/2011  T:  12/11/2011  Job:  [id number]

## 2012-12-16 ENCOUNTER — Other Ambulatory Visit: Payer: Medicare Other

## 2012-12-16 ENCOUNTER — Ambulatory Visit: Payer: Medicare Other | Admitting: Neurosurgery

## 2014-06-11 DIAGNOSIS — E1165 Type 2 diabetes mellitus with hyperglycemia: Secondary | ICD-10-CM | POA: Diagnosis not present

## 2014-06-11 DIAGNOSIS — R972 Elevated prostate specific antigen [PSA]: Secondary | ICD-10-CM | POA: Diagnosis not present

## 2014-06-11 DIAGNOSIS — I110 Hypertensive heart disease with heart failure: Secondary | ICD-10-CM | POA: Diagnosis not present

## 2014-06-11 DIAGNOSIS — R5383 Other fatigue: Secondary | ICD-10-CM | POA: Diagnosis not present

## 2014-06-11 DIAGNOSIS — D510 Vitamin B12 deficiency anemia due to intrinsic factor deficiency: Secondary | ICD-10-CM | POA: Diagnosis not present

## 2014-07-12 DIAGNOSIS — Z7901 Long term (current) use of anticoagulants: Secondary | ICD-10-CM | POA: Diagnosis not present

## 2014-07-12 DIAGNOSIS — R5382 Chronic fatigue, unspecified: Secondary | ICD-10-CM | POA: Diagnosis not present

## 2014-07-12 DIAGNOSIS — I119 Hypertensive heart disease without heart failure: Secondary | ICD-10-CM | POA: Diagnosis not present

## 2014-07-12 DIAGNOSIS — J45909 Unspecified asthma, uncomplicated: Secondary | ICD-10-CM | POA: Diagnosis not present

## 2014-07-12 DIAGNOSIS — E784 Other hyperlipidemia: Secondary | ICD-10-CM | POA: Diagnosis not present

## 2014-08-09 DIAGNOSIS — I119 Hypertensive heart disease without heart failure: Secondary | ICD-10-CM | POA: Diagnosis not present

## 2014-08-09 DIAGNOSIS — Z7901 Long term (current) use of anticoagulants: Secondary | ICD-10-CM | POA: Diagnosis not present

## 2014-08-09 DIAGNOSIS — R5382 Chronic fatigue, unspecified: Secondary | ICD-10-CM | POA: Diagnosis not present

## 2014-08-09 DIAGNOSIS — E784 Other hyperlipidemia: Secondary | ICD-10-CM | POA: Diagnosis not present

## 2014-08-18 ENCOUNTER — Encounter (HOSPITAL_COMMUNITY): Payer: Self-pay | Admitting: *Deleted

## 2014-08-18 ENCOUNTER — Emergency Department (HOSPITAL_COMMUNITY)
Admission: EM | Admit: 2014-08-18 | Discharge: 2014-08-18 | Disposition: A | Discharge status: Home / Self Care | Payer: Medicare Other | Attending: Emergency Medicine | Admitting: Emergency Medicine

## 2014-08-18 DIAGNOSIS — Z79899 Other long term (current) drug therapy: Secondary | ICD-10-CM | POA: Insufficient documentation

## 2014-08-18 DIAGNOSIS — Z880 Allergy status to penicillin: Secondary | ICD-10-CM | POA: Diagnosis not present

## 2014-08-18 DIAGNOSIS — Y998 Other external cause status: Secondary | ICD-10-CM | POA: Insufficient documentation

## 2014-08-18 DIAGNOSIS — W57XXXA Bitten or stung by nonvenomous insect and other nonvenomous arthropods, initial encounter: Secondary | ICD-10-CM | POA: Diagnosis not present

## 2014-08-18 DIAGNOSIS — Y9289 Other specified places as the place of occurrence of the external cause: Secondary | ICD-10-CM | POA: Insufficient documentation

## 2014-08-18 DIAGNOSIS — S80861A Insect bite (nonvenomous), right lower leg, initial encounter: Secondary | ICD-10-CM | POA: Diagnosis not present

## 2014-08-18 DIAGNOSIS — Z8679 Personal history of other diseases of the circulatory system: Secondary | ICD-10-CM | POA: Insufficient documentation

## 2014-08-18 DIAGNOSIS — I69320 Aphasia following cerebral infarction: Secondary | ICD-10-CM | POA: Insufficient documentation

## 2014-08-18 DIAGNOSIS — S80261A Insect bite (nonvenomous), right knee, initial encounter: Secondary | ICD-10-CM | POA: Diagnosis not present

## 2014-08-18 DIAGNOSIS — Y9389 Activity, other specified: Secondary | ICD-10-CM | POA: Diagnosis not present

## 2014-08-18 DIAGNOSIS — Z720 Tobacco use: Secondary | ICD-10-CM | POA: Diagnosis not present

## 2014-08-18 DIAGNOSIS — Z7901 Long term (current) use of anticoagulants: Secondary | ICD-10-CM | POA: Diagnosis not present

## 2014-08-18 MED ORDER — DOXYCYCLINE HYCLATE 100 MG PO CAPS: ORAL_CAPSULE | ORAL | Status: DC

## 2014-08-18 NOTE — ED Provider Notes (Signed)
CSN: [id number]     Arrival date & time 08/18/14  1542 History  This chart was scribed for Milton Ferguson, MD by Tula Nakayama, ED Scribe. This patient was seen in room APA05/[facility]05 and the patient's care was started at 4:35 PM.    No chief complaint on file.  Patient is a 69 y.o. male presenting with animal bite. The history is provided by the patient. No language interpreter was used.  Animal Bite Contact animal:  Insect Location:  Leg Leg injury location:  R lower leg Time since incident:  2 days Pain details:    Quality:  Unable to specify   Severity:  No pain Incident location:  Outside Provoked: unprovoked   Relieved by:  None tried Worsened by:  Nothing tried Ineffective treatments:  None tried Associated symptoms: rash     HPI Comments: Frederick [name] is a 69 y.o. male with a history of CVA and carotid artery occlusion who presents to the Emergency Department complaining of a tick bite to his right posterior shin that occurred 2 days ago. Pt notes a healing wound where he removed the tick and a red rash to his medial right thigh as associated symptoms. Pt denies a history of similar rash.  PCP Dondiego   Past Medical History  Diagnosis Date  . Carotid artery occlusion   . Stroke 2005   Past Surgical History  Procedure Laterality Date  . Carotid endarterectomy  2005    CVA  . Carotid endarterectomy  07/30/2008    Left CEA   Family History  Problem Relation Age of Onset  . Diabetes Mother   . Heart disease Father   . Heart attack Father    History  Substance Use Topics  . Smoking status: Current Every Day Smoker -- 1.00 packs/day    Types: Cigarettes  . Smokeless tobacco: Not on file  . Alcohol Use: No    Review of Systems  Constitutional: Negative for appetite change and fatigue.  HENT: Negative for congestion, ear discharge and sinus pressure.   Eyes: Negative for discharge.  Respiratory: Negative for cough.   Cardiovascular: Negative for chest pain.   Gastrointestinal: Negative for abdominal pain and diarrhea.  Genitourinary: Negative for frequency and hematuria.  Musculoskeletal: Negative for back pain.  Skin: Positive for rash and wound.  Neurological: Negative for seizures and headaches.  Psychiatric/Behavioral: Negative for hallucinations.  All other systems reviewed and are negative.     Allergies  Penicillins  Home Medications   Prior to Admission medications   Medication Sig Start Date End Date Taking? Authorizing Provider  amLODipine (NORVASC) 10 MG tablet Take 10 mg by mouth daily.      Historical Provider, MD  atenolol (TENORMIN) 50 MG tablet Take 50 mg by mouth daily.      Historical Provider, MD  buPROPion (ZYBAN) 150 MG 12 hr tablet One tab po qam for 3 days, then increase to one tab BID for 7 weeks 05/21/11   Tammi Triplett, PA-C  enalapril (VASOTEC) 5 MG tablet Take 5 mg by mouth daily.      Historical Provider, MD  pravastatin (PRAVACHOL) 40 MG tablet Take 40 mg by mouth daily.      Historical Provider, MD  Triprolidine-Pseudoephedrine (WAL-ACT PO) Take 1 tablet by mouth 2 (two) times daily.      Historical Provider, MD  warfarin (COUMADIN) 2.5 MG tablet Take 2.5 mg by mouth daily.      Historical Provider, MD   BP 116/61 mmHg  Pulse 64  Temp(Src) 97.8 F (36.6 C) (Oral)  Resp 18  Ht 5\' 10"  (1.778 m)  Wt 140 lb (63.504 kg)  BMI 20.09 kg/m2  SpO2 97% Physical Exam  Constitutional: He is oriented to person, place, and time. He appears well-developed.  HENT:  Head: Normocephalic.  Eyes: Conjunctivae are normal.  Neck: No tracheal deviation present.  Cardiovascular:  No murmur heard. Musculoskeletal: Normal range of motion.  Neurological: He is oriented to person, place, and time.  Skin: Skin is warm.  Well-healed tick medial right knee; red streaks in right thigh erythema migrans   Psychiatric: He has a normal mood and affect.  Nursing note and vitals reviewed.   ED Course  Procedures    DIAGNOSTIC STUDIES: Oxygen Saturation is 97% on RA, normal by my interpretation.    COORDINATION OF CARE: 4:37 PM Discussed treatment plan with pt at bedside and pt agreed to plan.   Labs Review Labs Reviewed - No data to display  Imaging Review No results found.   EKG Interpretation None      MDM   Final diagnoses:  None   Tick eposure, lyme dz tx with docy and follow up The chart was scribed for me under my direct supervision.  I personally performed the history, physical, and medical decision making and all procedures in the evaluation of this patient.Milton Ferguson, MD 08/18/14 [phone]

## 2014-08-18 NOTE — ED Notes (Signed)
Tick bite to rt popliteal area, pt removed 2 days ago, Now has red area to upper medial thigh

## 2014-08-18 NOTE — Discharge Instructions (Signed)
Follow up with your md next week. °

## 2014-09-10 DIAGNOSIS — R5382 Chronic fatigue, unspecified: Secondary | ICD-10-CM | POA: Diagnosis not present

## 2014-09-10 DIAGNOSIS — Z7901 Long term (current) use of anticoagulants: Secondary | ICD-10-CM | POA: Diagnosis not present

## 2014-09-10 DIAGNOSIS — I4891 Unspecified atrial fibrillation: Secondary | ICD-10-CM | POA: Diagnosis not present

## 2014-09-10 DIAGNOSIS — I110 Hypertensive heart disease with heart failure: Secondary | ICD-10-CM | POA: Diagnosis not present

## 2014-10-18 DIAGNOSIS — Z7901 Long term (current) use of anticoagulants: Secondary | ICD-10-CM | POA: Diagnosis not present

## 2014-10-18 DIAGNOSIS — E860 Dehydration: Secondary | ICD-10-CM | POA: Diagnosis not present

## 2014-11-16 DIAGNOSIS — I119 Hypertensive heart disease without heart failure: Secondary | ICD-10-CM | POA: Diagnosis not present

## 2014-11-16 DIAGNOSIS — R5382 Chronic fatigue, unspecified: Secondary | ICD-10-CM | POA: Diagnosis not present

## 2014-11-16 DIAGNOSIS — M2550 Pain in unspecified joint: Secondary | ICD-10-CM | POA: Diagnosis not present

## 2014-11-16 DIAGNOSIS — E784 Other hyperlipidemia: Secondary | ICD-10-CM | POA: Diagnosis not present

## 2014-12-27 DIAGNOSIS — M2550 Pain in unspecified joint: Secondary | ICD-10-CM | POA: Diagnosis not present

## 2014-12-27 DIAGNOSIS — I119 Hypertensive heart disease without heart failure: Secondary | ICD-10-CM | POA: Diagnosis not present

## 2014-12-27 DIAGNOSIS — J449 Chronic obstructive pulmonary disease, unspecified: Secondary | ICD-10-CM | POA: Diagnosis not present

## 2014-12-27 DIAGNOSIS — E784 Other hyperlipidemia: Secondary | ICD-10-CM | POA: Diagnosis not present

## 2015-02-08 DIAGNOSIS — R5382 Chronic fatigue, unspecified: Secondary | ICD-10-CM | POA: Diagnosis not present

## 2015-02-08 DIAGNOSIS — I119 Hypertensive heart disease without heart failure: Secondary | ICD-10-CM | POA: Diagnosis not present

## 2015-02-08 DIAGNOSIS — E784 Other hyperlipidemia: Secondary | ICD-10-CM | POA: Diagnosis not present

## 2015-02-08 DIAGNOSIS — Z7901 Long term (current) use of anticoagulants: Secondary | ICD-10-CM | POA: Diagnosis not present

## 2015-03-11 DIAGNOSIS — R5382 Chronic fatigue, unspecified: Secondary | ICD-10-CM | POA: Diagnosis not present

## 2015-03-11 DIAGNOSIS — I110 Hypertensive heart disease with heart failure: Secondary | ICD-10-CM | POA: Diagnosis not present

## 2015-03-11 DIAGNOSIS — M2550 Pain in unspecified joint: Secondary | ICD-10-CM | POA: Diagnosis not present

## 2015-03-11 DIAGNOSIS — Z7901 Long term (current) use of anticoagulants: Secondary | ICD-10-CM | POA: Diagnosis not present

## 2015-04-11 DIAGNOSIS — E784 Other hyperlipidemia: Secondary | ICD-10-CM | POA: Diagnosis not present

## 2015-04-11 DIAGNOSIS — R5382 Chronic fatigue, unspecified: Secondary | ICD-10-CM | POA: Diagnosis not present

## 2015-04-11 DIAGNOSIS — I110 Hypertensive heart disease with heart failure: Secondary | ICD-10-CM | POA: Diagnosis not present

## 2015-04-11 DIAGNOSIS — Z7901 Long term (current) use of anticoagulants: Secondary | ICD-10-CM | POA: Diagnosis not present

## 2015-04-11 DIAGNOSIS — M2550 Pain in unspecified joint: Secondary | ICD-10-CM | POA: Diagnosis not present

## 2015-05-16 DIAGNOSIS — I110 Hypertensive heart disease with heart failure: Secondary | ICD-10-CM | POA: Diagnosis not present

## 2015-05-16 DIAGNOSIS — Z7901 Long term (current) use of anticoagulants: Secondary | ICD-10-CM | POA: Diagnosis not present

## 2015-05-16 DIAGNOSIS — R5382 Chronic fatigue, unspecified: Secondary | ICD-10-CM | POA: Diagnosis not present

## 2015-05-16 DIAGNOSIS — E784 Other hyperlipidemia: Secondary | ICD-10-CM | POA: Diagnosis not present

## 2016-06-06 ENCOUNTER — Emergency Department (HOSPITAL_COMMUNITY): Payer: Medicare Other

## 2016-06-06 ENCOUNTER — Emergency Department (HOSPITAL_COMMUNITY)
Admission: EM | Admit: 2016-06-06 | Discharge: 2016-06-06 | Disposition: A | Discharge status: Home / Self Care | Payer: Medicare Other | Attending: Emergency Medicine | Admitting: Emergency Medicine

## 2016-06-06 ENCOUNTER — Encounter (HOSPITAL_COMMUNITY): Payer: Self-pay | Admitting: *Deleted

## 2016-06-06 DIAGNOSIS — I10 Essential (primary) hypertension: Secondary | ICD-10-CM | POA: Diagnosis not present

## 2016-06-06 DIAGNOSIS — R112 Nausea with vomiting, unspecified: Secondary | ICD-10-CM | POA: Insufficient documentation

## 2016-06-06 DIAGNOSIS — R42 Dizziness and giddiness: Secondary | ICD-10-CM | POA: Diagnosis present

## 2016-06-06 DIAGNOSIS — Z7901 Long term (current) use of anticoagulants: Secondary | ICD-10-CM | POA: Diagnosis not present

## 2016-06-06 DIAGNOSIS — Z79899 Other long term (current) drug therapy: Secondary | ICD-10-CM | POA: Diagnosis not present

## 2016-06-06 DIAGNOSIS — F17210 Nicotine dependence, cigarettes, uncomplicated: Secondary | ICD-10-CM | POA: Insufficient documentation

## 2016-06-06 HISTORY — DX: Essential (primary) hypertension: I10

## 2016-06-06 HISTORY — DX: Dizziness and giddiness: R42

## 2016-06-06 LAB — CBC WITH DIFFERENTIAL/PLATELET
BASOS PCT: 0 %
Basophils Absolute: 0 10*3/uL (ref 0.0–0.1)
EOS ABS: 0 10*3/uL (ref 0.0–0.7)
Eosinophils Relative: 0 %
HCT: 38 % — ABNORMAL LOW (ref 39.0–52.0)
HEMOGLOBIN: 11.8 g/dL — AB (ref 13.0–17.0)
Lymphocytes Relative: 4 %
Lymphs Abs: 0.5 10*3/uL — ABNORMAL LOW (ref 0.7–4.0)
MCH: 25.9 pg — ABNORMAL LOW (ref 26.0–34.0)
MCHC: 31.1 g/dL (ref 30.0–36.0)
MCV: 83.5 fL (ref 78.0–100.0)
Monocytes Absolute: 0.9 10*3/uL (ref 0.1–1.0)
Monocytes Relative: 8 %
Neutro Abs: 9.2 10*3/uL — ABNORMAL HIGH (ref 1.7–7.7)
Neutrophils Relative %: 88 %
PLATELETS: 341 10*3/uL (ref 150–400)
RBC: 4.55 MIL/uL (ref 4.22–5.81)
RDW: 16 % — ABNORMAL HIGH (ref 11.5–15.5)
WBC: 10.6 10*3/uL — AB (ref 4.0–10.5)

## 2016-06-06 LAB — BASIC METABOLIC PANEL
Anion gap: 12 (ref 5–15)
BUN: 15 mg/dL (ref 6–20)
CHLORIDE: 99 mmol/L — AB (ref 101–111)
CO2: 28 mmol/L (ref 22–32)
CREATININE: 0.96 mg/dL (ref 0.61–1.24)
Calcium: 10.5 mg/dL — ABNORMAL HIGH (ref 8.9–10.3)
Glucose, Bld: 123 mg/dL — ABNORMAL HIGH (ref 65–99)
Potassium: 3.3 mmol/L — ABNORMAL LOW (ref 3.5–5.1)
SODIUM: 139 mmol/L (ref 135–145)

## 2016-06-06 MED ORDER — LORAZEPAM 1 MG PO TABS: 1.0000 mg | ORAL_TABLET | Freq: Three times a day (TID) | ORAL | 0 refills | Status: DC | PRN

## 2016-06-06 MED ORDER — LORAZEPAM 1 MG PO TABS
ORAL_TABLET | ORAL | Status: AC
  Filled 2016-06-06: qty 1

## 2016-06-06 MED ORDER — MECLIZINE HCL 25 MG PO TABS: 25.0000 mg | ORAL_TABLET | Freq: Three times a day (TID) | ORAL | 0 refills | Status: DC | PRN

## 2016-06-06 MED ORDER — LORAZEPAM 1 MG PO TABS
1.0000 mg | ORAL_TABLET | Freq: Once | ORAL | Status: AC
  Administered 2016-06-06: 1 mg via ORAL

## 2016-06-06 MED ORDER — MECLIZINE HCL 12.5 MG PO TABS
25.0000 mg | ORAL_TABLET | Freq: Once | ORAL | Status: AC
  Administered 2016-06-06: 25 mg via ORAL
  Filled 2016-06-06: qty 2

## 2016-06-06 NOTE — ED Triage Notes (Signed)
Pt comes in by EMS from home for dizziness, n/v/d for the last 4-5 days. EMS states pt hasn't taken his medication in 4-5 days as well. Pt states the dizziness started first. He has hx of vertigo. Pt denies any pain.

## 2016-06-06 NOTE — ED Notes (Signed)
Pt states he is less dizziness but states he was unable to walk in CT.

## 2016-06-06 NOTE — Discharge Instructions (Signed)
Medication for dizziness and relaxation. CT head and MRI of your head showed no acute findings. Rest. Follow-up your primary care doctor.

## 2016-06-06 NOTE — ED Notes (Signed)
Pt denies any diarrhea. States he has n/v when he becomes dizzy. Dizziness worsens with positional changes.

## 2016-06-06 NOTE — ED Notes (Signed)
Attempted to walk pt. Pt sat up in bed and became dizzy again. MD made aware

## 2016-06-06 NOTE — ED Notes (Signed)
Pt tolerating PO fluids at this time. NAD noted

## 2016-06-06 NOTE — ED Notes (Signed)
Pt states he is still having dizziness.

## 2016-06-06 NOTE — ED Provider Notes (Signed)
[facility] DEPT Provider Note   CSN: [id number] Arrival date & time: 06/06/16  1010    By signing my name below, I, Avnee Patel, attest that this documentation has been prepared under the direction and in the presence of Nat Christen, MD  Electronically Signed: Delton Prairie, ED Scribe. 06/06/16. 10:44 AM.   History   Chief Complaint Chief Complaint  Patient presents with  . Dizziness   The history is provided by the patient. No language interpreter was used.   HPI Comments:  Frederick [name] is a 70 y.o. male, with a hx of carotid artery occlusion, HTN, stroke (7-8 years ago) and vertigo, who presents to the Emergency Department complaining of gradually worsening, persistent dizziness which began in the PM 3 day ago. His symptoms are worse with ambulation. He also reports nausea and multiple episodes of vomiting. Pt denies weakness, any other associated symptoms and any other modifying factors at this time. Pt is on coumadin and last dose was 3 days ago.   Past Medical History:  Diagnosis Date  . Carotid artery occlusion   . Hypertension   . Stroke ([place]) 2005  . Vertigo     Patient Active Problem List   Diagnosis Date Noted  . Occlusion and stenosis of carotid artery without mention of cerebral infarction 12/11/2011    Past Surgical History:  Procedure Laterality Date  . CAROTID ENDARTERECTOMY  2005   CVA  . CAROTID ENDARTERECTOMY  07/30/2008   Left CEA       Home Medications    Prior to Admission medications   Medication Sig Start Date End Date Taking? Authorizing Provider  amLODipine (NORVASC) 10 MG tablet Take 10 mg by mouth daily.     Yes Historical Provider, MD  atenolol (TENORMIN) 50 MG tablet Take 50 mg by mouth daily.     Yes Historical Provider, MD  buPROPion (ZYBAN) 150 MG 12 hr tablet One tab po qam for 3 days, then increase to one tab BID for 7 weeks Patient taking differently: Take 150 mg by mouth 2 (two) times daily. One tab po qam for 3 days, then  increase to one tab BID for 7 weeks 05/21/11  Yes Tammy Triplett, PA-C  enalapril (VASOTEC) 5 MG tablet Take 5 mg by mouth daily.     Yes Historical Provider, MD  ferrous sulfate 325 (65 FE) MG tablet Take 1 tablet by mouth daily. 05/18/16  Yes Historical Provider, MD  pravastatin (PRAVACHOL) 40 MG tablet Take 40 mg by mouth daily.     Yes Historical Provider, MD  Triprolidine-Pseudoephedrine (WAL-ACT PO) Take 1 tablet by mouth 2 (two) times daily.     Yes Historical Provider, MD  warfarin (COUMADIN) 1 MG tablet Take 1 tablet by mouth daily. 05/14/16  Yes Historical Provider, MD  LORazepam (ATIVAN) 1 MG tablet Take 1 tablet (1 mg total) by mouth 3 (three) times daily as needed for anxiety (or restlessness). 06/06/16   Nat Christen, MD  meclizine (ANTIVERT) 25 MG tablet Take 1 tablet (25 mg total) by mouth 3 (three) times daily as needed for dizziness. 06/06/16   Nat Christen, MD    Family History Family History  Problem Relation Age of Onset  . Diabetes Mother   . Heart disease Father   . Heart attack Father     Social History Social History  Substance Use Topics  . Smoking status: Current Every Day Smoker    Packs/day: 1.00    Types: Cigarettes  . Smokeless tobacco: Never  Used  . Alcohol use No     Allergies   Penicillins   Review of Systems Review of Systems  Gastrointestinal: Positive for nausea and vomiting.  Neurological: Positive for dizziness. Negative for weakness.  All other systems reviewed and are negative.  Physical Exam Updated Vital Signs BP 109/72   Pulse 70   Temp 97.5 F (36.4 C)   Resp 16   Ht '5\' 10"'$  (1.778 m)   Wt 130 lb (59 kg)   SpO2 98%   BMI 18.65 kg/m   Physical Exam  Constitutional: He is oriented to person, place, and time. He appears well-developed and well-nourished.  HENT:  Head: Normocephalic and atraumatic.  Eyes: Conjunctivae are normal.  Neck: Neck supple.  Cardiovascular: Normal rate and regular rhythm.   Pulmonary/Chest: Effort  normal and breath sounds normal.  Abdominal: Soft. Bowel sounds are normal.  Musculoskeletal: Normal range of motion.  Neurological: He is alert and oriented to person, place, and time.  Skin: Skin is warm and dry.  Psychiatric: He has a normal mood and affect. His behavior is normal.  Nursing note and vitals reviewed.    ED Treatments / Results  DIAGNOSTIC STUDIES:  Oxygen Saturation is 97% on RA, normal by my interpretation.    COORDINATION OF CARE:  10:42 AM CT-head and basic lab work. Discussed treatment plan with pt at bedside and pt agreed to plan.  Labs (all labs ordered are listed, but only abnormal results are displayed) Labs Reviewed  CBC WITH DIFFERENTIAL/PLATELET - Abnormal; Notable for the following:       Result Value   WBC 10.6 (*)    Hemoglobin 11.8 (*)    HCT 38.0 (*)    MCH 25.9 (*)    RDW 16.0 (*)    Neutro Abs 9.2 (*)    Lymphs Abs 0.5 (*)    All other components within normal limits  BASIC METABOLIC PANEL - Abnormal; Notable for the following:    Potassium 3.3 (*)    Chloride 99 (*)    Glucose, Bld 123 (*)    Calcium 10.5 (*)    All other components within normal limits    EKG  EKG Interpretation  Date/Time:  Wednesday June 06 2016 10:22:15 EST Ventricular Rate:  62 PR Interval:    QRS Duration: 84 QT Interval:  486 QTC Calculation: 494 R Axis:   75 Text Interpretation:  Sinus rhythm Borderline prolonged QT interval Confirmed by Lacinda Axon  MD, Harmony Sandell ([id number]) on 06/06/2016 12:18:01 PM       Radiology Ct Head Wo Contrast  Result Date: 06/06/2016 CLINICAL DATA:  Dizziness, nausea, vomiting for past 4-5 days. EXAM: CT HEAD WITHOUT CONTRAST TECHNIQUE: Contiguous axial images were obtained from the base of the skull through the vertex without intravenous contrast. COMPARISON:  08/27/2010 FINDINGS: Brain: Old left infarct in the region of the insular cortex with encephalomalacia. There is atrophy and chronic small vessel disease changes. No acute  intracranial abnormality. Specifically, no hemorrhage, hydrocephalus, mass lesion, acute infarction, or significant intracranial injury. Vascular: No hyperdense vessel or unexpected calcification. Skull: No acute calvarial abnormality. Sinuses/Orbits: Visualized paranasal sinuses and mastoids clear. Orbital soft tissues unremarkable. Other: None IMPRESSION: Old left MCA infarct. Atrophy, chronic small vessel disease. No acute intracranial abnormality. Electronically Signed   By: Rolm Baptise M.D.   On: 06/06/2016 12:02   Mr Brain Wo Contrast  Result Date: 06/06/2016 CLINICAL DATA:  Vertigo.  High risk for CVA. EXAM: MRI HEAD WITHOUT CONTRAST TECHNIQUE: Multiplanar,  multiecho pulse sequences of the brain and surrounding structures were obtained without intravenous contrast. COMPARISON:  Brain MRI 05/31/2008.  Head CT from earlier today FINDINGS: Brain: No acute infarction, hemorrhage, hydrocephalus, extra-axial collection or mass lesion. Remote left MCA territory infarct affecting the insula and inferior frontal lobe. Deep white matter tracts were affected and there is wallerian degeneration seen in the cortical spinal tracts on the left. There is chronic sulcal widening at the vertex in the biparietal region with small areas of cortically based gliosis likely from previous infarcts. Chronic microvascular disease with confluent ischemic gliosis in the cerebral white matter. Prominent atrophy of the bilateral cerebellum, also seen in 2009. Hemosiderin staining along the remote left MCA territory infarct. No generalized chronic hemorrhagic foci. Vascular: Preserved flow voids. Skull and upper cervical spine: Negative Sinuses/Orbits: Negative. No mastoid or middle ear fluid. Grossly symmetric labyrinthine signal. IMPRESSION: 1. No acute finding, including infarct. 2. Extensive chronic microvascular disease and remote left MCA branch infarct. 3. Cerebellar and biparietal predominant atrophy. Electronically Signed    By: Monte Fantasia M.D.   On: 06/06/2016 14:26    Procedures Procedures (including critical care time)  Medications Ordered in ED Medications  meclizine (ANTIVERT) tablet 25 mg (25 mg Oral Given 06/06/16 1112)  LORazepam (ATIVAN) tablet 1 mg (1 mg Oral Given 06/06/16 1551)     Initial Impression / Assessment and Plan / ED Course  I have reviewed the triage vital signs and the nursing notes.  Pertinent labs & imaging results that were available during my care of the patient were reviewed by me and considered in my medical decision making (see chart for details).  Clinical Course     Patient presents with vertiginous-like symptoms. CT head and MRI brain showed no acute findings. Discharge medication Ativan 1 mg and Antivert 25 mg.  Discussed findings with the patient and his brother  Final Clinical Impressions(s) / ED Diagnoses   Final diagnoses:  Vertigo    New Prescriptions New Prescriptions   LORAZEPAM (ATIVAN) 1 MG TABLET    Take 1 tablet (1 mg total) by mouth 3 (three) times daily as needed for anxiety (or restlessness).   MECLIZINE (ANTIVERT) 25 MG TABLET    Take 1 tablet (25 mg total) by mouth 3 (three) times daily as needed for dizziness.  I personally performed the services described in this documentation, which was scribed in my presence. The recorded information has been reviewed and is accurate.      Nat Christen, MD 06/06/16 1606

## 2016-06-10 ENCOUNTER — Inpatient Hospital Stay (HOSPITAL_COMMUNITY)
Admission: EM | Admit: 2016-06-10 | Discharge: 2016-06-20 | DRG: 391 | Disposition: A | Discharge status: Skilled Nursing Facility | Payer: Medicare Other | Attending: Family Medicine | Admitting: Family Medicine

## 2016-06-10 ENCOUNTER — Encounter (HOSPITAL_COMMUNITY): Payer: Self-pay | Admitting: Emergency Medicine

## 2016-06-10 ENCOUNTER — Emergency Department (HOSPITAL_COMMUNITY): Payer: Medicare Other

## 2016-06-10 DIAGNOSIS — G9340 Encephalopathy, unspecified: Secondary | ICD-10-CM

## 2016-06-10 DIAGNOSIS — R296 Repeated falls: Secondary | ICD-10-CM | POA: Diagnosis present

## 2016-06-10 DIAGNOSIS — F17210 Nicotine dependence, cigarettes, uncomplicated: Secondary | ICD-10-CM | POA: Diagnosis present

## 2016-06-10 DIAGNOSIS — E441 Mild protein-calorie malnutrition: Secondary | ICD-10-CM | POA: Diagnosis present

## 2016-06-10 DIAGNOSIS — Z79899 Other long term (current) drug therapy: Secondary | ICD-10-CM | POA: Diagnosis not present

## 2016-06-10 DIAGNOSIS — Z833 Family history of diabetes mellitus: Secondary | ICD-10-CM

## 2016-06-10 DIAGNOSIS — R627 Adult failure to thrive: Secondary | ICD-10-CM | POA: Diagnosis present

## 2016-06-10 DIAGNOSIS — G9341 Metabolic encephalopathy: Secondary | ICD-10-CM | POA: Diagnosis present

## 2016-06-10 DIAGNOSIS — R680 Hypothermia, not associated with low environmental temperature: Secondary | ICD-10-CM | POA: Diagnosis present

## 2016-06-10 DIAGNOSIS — K529 Noninfective gastroenteritis and colitis, unspecified: Secondary | ICD-10-CM | POA: Diagnosis present

## 2016-06-10 DIAGNOSIS — A419 Sepsis, unspecified organism: Secondary | ICD-10-CM

## 2016-06-10 DIAGNOSIS — I10 Essential (primary) hypertension: Secondary | ICD-10-CM | POA: Diagnosis present

## 2016-06-10 DIAGNOSIS — I959 Hypotension, unspecified: Secondary | ICD-10-CM | POA: Diagnosis present

## 2016-06-10 DIAGNOSIS — R918 Other nonspecific abnormal finding of lung field: Secondary | ICD-10-CM

## 2016-06-10 DIAGNOSIS — Z8249 Family history of ischemic heart disease and other diseases of the circulatory system: Secondary | ICD-10-CM

## 2016-06-10 DIAGNOSIS — E860 Dehydration: Secondary | ICD-10-CM | POA: Diagnosis present

## 2016-06-10 DIAGNOSIS — I6529 Occlusion and stenosis of unspecified carotid artery: Secondary | ICD-10-CM

## 2016-06-10 DIAGNOSIS — M6281 Muscle weakness (generalized): Secondary | ICD-10-CM

## 2016-06-10 DIAGNOSIS — R64 Cachexia: Secondary | ICD-10-CM | POA: Diagnosis present

## 2016-06-10 DIAGNOSIS — Z880 Allergy status to penicillin: Secondary | ICD-10-CM

## 2016-06-10 DIAGNOSIS — J910 Malignant pleural effusion: Secondary | ICD-10-CM | POA: Diagnosis present

## 2016-06-10 DIAGNOSIS — E7800 Pure hypercholesterolemia, unspecified: Secondary | ICD-10-CM | POA: Diagnosis present

## 2016-06-10 DIAGNOSIS — T68XXXA Hypothermia, initial encounter: Secondary | ICD-10-CM

## 2016-06-10 DIAGNOSIS — Z681 Body mass index (BMI) 19 or less, adult: Secondary | ICD-10-CM

## 2016-06-10 DIAGNOSIS — Z8673 Personal history of transient ischemic attack (TIA), and cerebral infarction without residual deficits: Secondary | ICD-10-CM | POA: Diagnosis not present

## 2016-06-10 DIAGNOSIS — I6521 Occlusion and stenosis of right carotid artery: Secondary | ICD-10-CM

## 2016-06-10 DIAGNOSIS — R2689 Other abnormalities of gait and mobility: Secondary | ICD-10-CM

## 2016-06-10 DIAGNOSIS — N179 Acute kidney failure, unspecified: Secondary | ICD-10-CM | POA: Diagnosis present

## 2016-06-10 DIAGNOSIS — R4182 Altered mental status, unspecified: Secondary | ICD-10-CM

## 2016-06-10 DIAGNOSIS — D649 Anemia, unspecified: Secondary | ICD-10-CM | POA: Diagnosis present

## 2016-06-10 DIAGNOSIS — T148XXA Other injury of unspecified body region, initial encounter: Secondary | ICD-10-CM

## 2016-06-10 DIAGNOSIS — C3432 Malignant neoplasm of lower lobe, left bronchus or lung: Secondary | ICD-10-CM

## 2016-06-10 DIAGNOSIS — Z7901 Long term (current) use of anticoagulants: Secondary | ICD-10-CM

## 2016-06-10 HISTORY — DX: Pure hypercholesterolemia, unspecified: E78.00

## 2016-06-10 LAB — CBC WITH DIFFERENTIAL/PLATELET
BASOS PCT: 0 %
Basophils Absolute: 0 10*3/uL (ref 0.0–0.1)
EOS ABS: 0 10*3/uL (ref 0.0–0.7)
EOS PCT: 0 %
HCT: 42.3 % (ref 39.0–52.0)
Hemoglobin: 13.4 g/dL (ref 13.0–17.0)
Lymphocytes Relative: 4 %
Lymphs Abs: 0.9 10*3/uL (ref 0.7–4.0)
MCH: 26.5 pg (ref 26.0–34.0)
MCHC: 31.7 g/dL (ref 30.0–36.0)
MCV: 83.8 fL (ref 78.0–100.0)
MONO ABS: 0.8 10*3/uL (ref 0.1–1.0)
MONOS PCT: 3 %
NEUTROS PCT: 93 %
Neutro Abs: 23.2 10*3/uL — ABNORMAL HIGH (ref 1.7–7.7)
PLATELETS: 392 10*3/uL (ref 150–400)
RBC: 5.05 MIL/uL (ref 4.22–5.81)
RDW: 16.4 % — AB (ref 11.5–15.5)
WBC: 25 10*3/uL — ABNORMAL HIGH (ref 4.0–10.5)

## 2016-06-10 LAB — COMPREHENSIVE METABOLIC PANEL
ALT: 14 U/L — ABNORMAL LOW (ref 17–63)
ANION GAP: 17 — AB (ref 5–15)
AST: 36 U/L (ref 15–41)
Albumin: 3.2 g/dL — ABNORMAL LOW (ref 3.5–5.0)
Alkaline Phosphatase: 85 U/L (ref 38–126)
BUN: 27 mg/dL — ABNORMAL HIGH (ref 6–20)
CO2: 24 mmol/L (ref 22–32)
Calcium: 10.9 mg/dL — ABNORMAL HIGH (ref 8.9–10.3)
Chloride: 101 mmol/L (ref 101–111)
Creatinine, Ser: 1.43 mg/dL — ABNORMAL HIGH (ref 0.61–1.24)
GFR calc non Af Amer: 48 mL/min — ABNORMAL LOW (ref >60)
GFR, EST AFRICAN AMERICAN: 55 mL/min — AB (ref >60)
Glucose, Bld: 236 mg/dL — ABNORMAL HIGH (ref 65–99)
POTASSIUM: 3.6 mmol/L (ref 3.5–5.1)
SODIUM: 142 mmol/L (ref 135–145)
Total Bilirubin: 1 mg/dL (ref 0.3–1.2)
Total Protein: 7.9 g/dL (ref 6.5–8.1)

## 2016-06-10 LAB — CBG MONITORING, ED: Glucose-Capillary: 208 mg/dL — ABNORMAL HIGH (ref 65–99)

## 2016-06-10 LAB — I-STAT CG4 LACTIC ACID, ED: Lactic Acid, Venous: 4.9 mmol/L (ref 0.5–1.9)

## 2016-06-10 LAB — RAPID URINE DRUG SCREEN, HOSP PERFORMED
Amphetamines: NOT DETECTED
BARBITURATES: NOT DETECTED
Benzodiazepines: POSITIVE — AB
Cocaine: NOT DETECTED
OPIATES: NOT DETECTED
TETRAHYDROCANNABINOL: NOT DETECTED

## 2016-06-10 LAB — PROCALCITONIN: Procalcitonin: 0.15 ng/mL

## 2016-06-10 LAB — URINALYSIS, ROUTINE W REFLEX MICROSCOPIC
Bilirubin Urine: NEGATIVE
Glucose, UA: NEGATIVE mg/dL
Ketones, ur: 20 mg/dL — AB
Leukocytes, UA: NEGATIVE
Nitrite: NEGATIVE
PH: 5 (ref 5.0–8.0)
Protein, ur: 30 mg/dL — AB
SPECIFIC GRAVITY, URINE: 1.02 (ref 1.005–1.030)

## 2016-06-10 LAB — LACTIC ACID, PLASMA
Lactic Acid, Venous: 3.3 mmol/L (ref 0.5–1.9)
Lactic Acid, Venous: 1.5 mmol/L (ref 0.5–1.9)

## 2016-06-10 LAB — AMMONIA: Ammonia: 11 umol/L (ref 9–35)

## 2016-06-10 LAB — MRSA PCR SCREENING: MRSA by PCR: NEGATIVE

## 2016-06-10 LAB — GLUCOSE, CAPILLARY
Glucose-Capillary: 83 mg/dL (ref 65–99)
Glucose-Capillary: 84 mg/dL (ref 65–99)

## 2016-06-10 LAB — PROTIME-INR
INR: 2.8
PROTHROMBIN TIME: 30.1 s — AB (ref 11.4–15.2)

## 2016-06-10 MED ORDER — SODIUM CHLORIDE 0.9 % IV SOLN
INTRAVENOUS | Status: AC
  Administered 2016-06-10: 19:00:00 via INTRAVENOUS

## 2016-06-10 MED ORDER — IOPAMIDOL (ISOVUE-300) INJECTION 61%
75.0000 mL | Freq: Once | INTRAVENOUS | Status: AC | PRN
  Administered 2016-06-10: 75 mL via INTRAVENOUS

## 2016-06-10 MED ORDER — INFLUENZA VAC SPLIT QUAD 0.5 ML IM SUSY
0.5000 mL | PREFILLED_SYRINGE | INTRAMUSCULAR | Status: DC
Start: 2016-06-11 — End: 2016-06-20

## 2016-06-10 MED ORDER — LEVOFLOXACIN IN D5W 750 MG/150ML IV SOLN: 750.0000 mg | INTRAVENOUS | Status: DC

## 2016-06-10 MED ORDER — SODIUM CHLORIDE 0.9 % IV BOLUS (SEPSIS)
1000.0000 mL | Freq: Once | INTRAVENOUS | Status: AC
  Administered 2016-06-10: 1000 mL via INTRAVENOUS

## 2016-06-10 MED ORDER — ONDANSETRON HCL 4 MG/2ML IJ SOLN
4.0000 mg | Freq: Four times a day (QID) | INTRAMUSCULAR | Status: DC | PRN
  Administered 2016-06-16: 4 mg via INTRAVENOUS
  Filled 2016-06-10 (×2): qty 2

## 2016-06-10 MED ORDER — ONDANSETRON HCL 4 MG PO TABS: 4.0000 mg | ORAL_TABLET | Freq: Four times a day (QID) | ORAL | Status: DC | PRN

## 2016-06-10 MED ORDER — POLYETHYLENE GLYCOL 3350 17 G PO PACK: 17.0000 g | PACK | Freq: Every day | ORAL | Status: DC | PRN

## 2016-06-10 MED ORDER — SODIUM CHLORIDE 0.9 % IV SOLN
INTRAVENOUS | Status: DC
  Administered 2016-06-10: 16:00:00 via INTRAVENOUS

## 2016-06-10 MED ORDER — LEVOFLOXACIN IN D5W 750 MG/150ML IV SOLN
750.0000 mg | Freq: Once | INTRAVENOUS | Status: AC
  Administered 2016-06-10: 750 mg via INTRAVENOUS
  Filled 2016-06-10: qty 150

## 2016-06-10 MED ORDER — METRONIDAZOLE IN NACL 5-0.79 MG/ML-% IV SOLN
500.0000 mg | Freq: Three times a day (TID) | INTRAVENOUS | Status: DC
  Administered 2016-06-11 – 2016-06-15 (×14): 500 mg via INTRAVENOUS
  Filled 2016-06-10 (×14): qty 100

## 2016-06-10 MED ORDER — BISACODYL 10 MG RE SUPP: 10.0000 mg | Freq: Every day | RECTAL | Status: DC | PRN

## 2016-06-10 MED ORDER — INSULIN ASPART 100 UNIT/ML ~~LOC~~ SOLN
0.0000 [IU] | Freq: Three times a day (TID) | SUBCUTANEOUS | Status: DC
  Administered 2016-06-14: 1 [IU] via SUBCUTANEOUS
  Administered 2016-06-14: 2 [IU] via SUBCUTANEOUS
  Administered 2016-06-15: 1 [IU] via SUBCUTANEOUS
  Administered 2016-06-15: 2 [IU] via SUBCUTANEOUS
  Administered 2016-06-15 – 2016-06-16 (×2): 1 [IU] via SUBCUTANEOUS
  Administered 2016-06-16: 2 [IU] via SUBCUTANEOUS
  Administered 2016-06-16 – 2016-06-18 (×3): 1 [IU] via SUBCUTANEOUS

## 2016-06-10 MED ORDER — PNEUMOCOCCAL VAC POLYVALENT 25 MCG/0.5ML IJ INJ: 0.5000 mL | INJECTION | INTRAMUSCULAR | Status: DC

## 2016-06-10 MED ORDER — INSULIN ASPART 100 UNIT/ML ~~LOC~~ SOLN: 0.0000 [IU] | SUBCUTANEOUS | Status: DC

## 2016-06-10 MED ORDER — ACETAMINOPHEN 650 MG RE SUPP: 650.0000 mg | Freq: Four times a day (QID) | RECTAL | Status: DC | PRN

## 2016-06-10 MED ORDER — ACETAMINOPHEN 325 MG PO TABS
650.0000 mg | ORAL_TABLET | Freq: Four times a day (QID) | ORAL | Status: DC | PRN
  Administered 2016-06-12 – 2016-06-16 (×4): 650 mg via ORAL
  Filled 2016-06-10 (×4): qty 2

## 2016-06-10 MED ORDER — METRONIDAZOLE IN NACL 5-0.79 MG/ML-% IV SOLN
500.0000 mg | Freq: Once | INTRAVENOUS | Status: AC
  Administered 2016-06-10: 500 mg via INTRAVENOUS
  Filled 2016-06-10: qty 100

## 2016-06-10 NOTE — ED Notes (Signed)
Called AC to bring warming blanket for bair hugger.  Pt given warm blankets at this time.

## 2016-06-10 NOTE — ED Triage Notes (Signed)
Pt neighbor brought him in on Monday and was seen for inner ear and released.  His neighbor bought him back and states that he keeps falling down and lives alone.  He states that he found him in the floor trying to make it to the bathroom, he states that he has not been eating and drinking normally or taking his medications.

## 2016-06-10 NOTE — Progress Notes (Signed)
Pharmacy Antibiotic Note  Frederick [name] is a 71 y.o. male admitted on 06/10/2016 with intra abdominal infection.  Pharmacy has been consulted for levaquin and flagyl dosing.Initial doses given in the ED  Plan: Cont flagyl 500 mg IV q8 hours Cont levaquin 750 mg IV q48 hours F/u renal function, cultures and clinical course  Height: '5\' 10"'$  (177.8 cm) Weight: 130 lb (59 kg) IBW/kg (Calculated) : 73  Temp (24hrs), Avg:96.9 F (36.1 C), Min:94.2 F (34.6 C), Max:98.3 F (36.8 C)   Recent Labs Lab 06/06/16 1113 06/10/16 1337 06/10/16 1347 06/10/16 1416  WBC 10.6* 25.0*  --   --   CREATININE 0.96 1.43*  --   --   LATICACIDVEN  --   --  4.90* 3.3*    Estimated Creatinine Clearance: 39.5 mL/min (by C-G formula based on SCr of 1.43 mg/dL (H)).    Allergies  Allergen Reactions  . Penicillins Nausea Only    Has patient had a PCN reaction causing immediate rash, facial/tongue/throat swelling, SOB or lightheadedness with hypotension: unknown Has patient had a PCN reaction causing severe rash involving mucus membranes or skin necrosis: unknown Has patient had a PCN reaction that required hospitalization: unknown Has patient had a PCN reaction occurring within the last 10 years: no If all of the above answers are "NO", then may proceed with Cephalosporin use.     Antimicrobials this admission: flagyl 1/7 >>  levaquin 1/7 >>    Thank you for allowing pharmacy to be a part of this patient's care.  Excell Seltzer Poteet 06/10/2016 5:29 PM

## 2016-06-10 NOTE — H&P (Signed)
History and Physical    Frederick [name] GQQ:[id number] DOB: [birth year] DOA: 06/10/2016  PCP: Maricela Curet, MD   Patient coming from: Home  Chief Complaint: Falls, failure to thrive  HPI: Frederick [name] is a 71 y.o. male with medical history significant for hypertension and carotid artery occlusion with remote CVA status post CEA, now presenting to the emergency department after being found on the floor and poorly responsive by his brother. History is obtained through the patient's brother who also lives next door to him, and through discussion with the ED personnel and review of the medical record. Patient has been suffering recurrent falls for the past month, has reportedly not been eating much at all or taking his medications over this interval, and has lost 30 pounds. He has complained of abdominal discomfort during this period and has had repeated nausea and vomiting. He was evaluated in the emergency department for this previously and was diagnosed with vertigo. His brother reports that every time he checks on the patient, he finds him on the floor. He checked on him today, found him on the floor and only minimally responsive and he was brought into the ED for evaluation.   ED Course: Upon arrival to the ED, patient is found to be hypothermic at 34.6 C, saturating well on room air, and with vitals otherwise stable. EKG features a sinus rhythm and chest x-ray is notable for a rounded focus in the left base medially. Chemistry panels notable for glucose of 236, a BUN of 27, and serum creatinine of 1.43, up from apparent baseline of 0.9. CBC is notable for a leukocytosis to 25,000 and lactic acid is elevated to 4.90. Noncontrast head CT is negative for acute intracranial abnormality. CT of the abdomen and pelvis was obtained and findings suggest colitis involving the descending colon, as well as a large mass in the center of the left lower lobe highly concerning for primary bronchogenic neoplasm.  There is a trace left pleural effusion noted on the abdominal CT which is likely malignant. Blood cultures were obtained, 3 L of normal saline were administered, and the patient was started on empiric Flagyl and Levaquin for sepsis suspected secondary to colitis. He has remained hemodynamically stable and in no respiratory distress, but is critically ill and will be admitted to the stepdown unit for ongoing evaluation and management of sepsis secondary to colitis with a large lung mass with recent weight-loss and failure to thrive.  Review of Systems:  Unable to obtain ROS secondary to the patient's clinical condition.  Past Medical History:  Diagnosis Date  . Carotid artery occlusion   . Hypertension   . Stroke ([place]) 2005  . Vertigo     Past Surgical History:  Procedure Laterality Date  . CAROTID ENDARTERECTOMY  2005   CVA  . CAROTID ENDARTERECTOMY  07/30/2008   Left CEA     reports that he has been smoking Cigarettes.  He has been smoking about 1.00 pack per day. He has never used smokeless tobacco. He reports that he does not drink alcohol or use drugs.  Allergies  Allergen Reactions  . Penicillins Nausea Only    Has patient had a PCN reaction causing immediate rash, facial/tongue/throat swelling, SOB or lightheadedness with hypotension: unknown Has patient had a PCN reaction causing severe rash involving mucus membranes or skin necrosis: unknown Has patient had a PCN reaction that required hospitalization: unknown Has patient had a PCN reaction occurring within the last 10 years: no If all  of the above answers are "NO", then may proceed with Cephalosporin use.     Family History  Problem Relation Age of Onset  . Diabetes Mother   . Heart disease Father   . Heart attack Father      Prior to Admission medications   Medication Sig Start Date End Date Taking? Authorizing Provider  amLODipine (NORVASC) 10 MG tablet Take 10 mg by mouth daily.     Yes Historical Provider, MD    enalapril (VASOTEC) 5 MG tablet Take 5 mg by mouth daily.     Yes Historical Provider, MD  ferrous sulfate 325 (65 FE) MG tablet Take 1 tablet by mouth daily. 05/18/16  Yes Historical Provider, MD  LORazepam (ATIVAN) 1 MG tablet Take 1 tablet (1 mg total) by mouth 3 (three) times daily as needed for anxiety (or restlessness). 06/06/16  Yes Nat Christen, MD  meclizine (ANTIVERT) 25 MG tablet Take 1 tablet (25 mg total) by mouth 3 (three) times daily as needed for dizziness. 06/06/16  Yes Nat Christen, MD  pravastatin (PRAVACHOL) 40 MG tablet Take 40 mg by mouth daily.     Yes Historical Provider, MD  buPROPion (ZYBAN) 150 MG 12 hr tablet One tab po qam for 3 days, then increase to one tab BID for 7 weeks Patient taking differently: Take 150 mg by mouth 2 (two) times daily. One tab po qam for 3 days, then increase to one tab BID for 7 weeks 05/21/11   Tammy Triplett, PA-C  COUMADIN 2.5 MG tablet Take 1 tablet by mouth daily. 03/15/16   Historical Provider, MD  warfarin (COUMADIN) 1 MG tablet Take 1 tablet by mouth daily. 05/14/16   Historical Provider, MD    Physical Exam: Vitals:   06/10/16 1530 06/10/16 1532 06/10/16 1600 06/10/16 1630  BP:  113/78 123/71 102/70  Pulse: 89 87 87 88  Resp:  '15 20 15  '$ Temp:      TempSrc:      SpO2: 98% 99% 96% 99%  Weight:      Height:          Constitutional: No acute respiratory distress, cachectic, obtunded Eyes: PERTLA, lids and conjunctivae normal ENMT: Mucous membranes are dry. Posterior pharynx clear of any exudate or lesions.   Neck: normal, supple, no masses, no thyromegaly Respiratory: Breath sounds mildly diminished bilaterally. Normal respiratory effort. No accessory muscle use.  Cardiovascular: S1 & S2 heard, regular rate and rhythm. No extremity edema. No significant JVD. Abdomen: No distension, no tenderness, no masses palpated. Bowel sounds normal.  Musculoskeletal: no clubbing / cyanosis. No joint deformity upper and lower extremities.     Skin: no significant rashes, lesions, ulcers. Warm, dry, well-perfused. Neurologic: No gross facial asymmetry. PERRL. Opens eyes briefly to voice. No verbal response. Patellar DTRs normal.   Psychiatric: Difficult to assess given the clinical situation.     Labs on Admission: I have personally reviewed following labs and imaging studies  CBC:  Recent Labs Lab 06/06/16 1113 06/10/16 1337  WBC 10.6* 25.0*  NEUTROABS 9.2* 23.2*  HGB 11.8* 13.4  HCT 38.0* 42.3  MCV 83.5 83.8  PLT 341 656   Basic Metabolic Panel:  Recent Labs Lab 06/06/16 1113 06/10/16 1337  NA 139 142  K 3.3* 3.6  CL 99* 101  CO2 28 24  GLUCOSE 123* 236*  BUN 15 27*  CREATININE 0.96 1.43*  CALCIUM 10.5* 10.9*   GFR: Estimated Creatinine Clearance: 39.5 mL/min (by C-G formula based on SCr of  1.43 mg/dL (H)). Liver Function Tests:  Recent Labs Lab 06/10/16 1337  AST 36  ALT 14*  ALKPHOS 85  BILITOT 1.0  PROT 7.9  ALBUMIN 3.2*   No results for input(s): LIPASE, AMYLASE in the last 168 hours.  Recent Labs Lab 06/10/16 1338  AMMONIA 11   Coagulation Profile:  Recent Labs Lab 06/10/16 1337  INR 2.80   Cardiac Enzymes: No results for input(s): CKTOTAL, CKMB, CKMBINDEX, TROPONINI in the last 168 hours. BNP (last 3 results) No results for input(s): PROBNP in the last 8760 hours. HbA1C: No results for input(s): HGBA1C in the last 72 hours. CBG:  Recent Labs Lab 06/10/16 1348  GLUCAP 208*   Lipid Profile: No results for input(s): CHOL, HDL, LDLCALC, TRIG, CHOLHDL, LDLDIRECT in the last 72 hours. Thyroid Function Tests: No results for input(s): TSH, T4TOTAL, FREET4, T3FREE, THYROIDAB in the last 72 hours. Anemia Panel: No results for input(s): VITAMINB12, FOLATE, FERRITIN, TIBC, IRON, RETICCTPCT in the last 72 hours. Urine analysis:    Component Value Date/Time   COLORURINE YELLOW 06/10/2016 1352   APPEARANCEUR CLEAR 06/10/2016 1352   LABSPEC 1.020 06/10/2016 1352   PHURINE  5.0 06/10/2016 1352   GLUCOSEU NEGATIVE 06/10/2016 1352   HGBUR SMALL (A) 06/10/2016 1352   BILIRUBINUR NEGATIVE 06/10/2016 1352   KETONESUR 20 (A) 06/10/2016 1352   PROTEINUR 30 (A) 06/10/2016 1352   UROBILINOGEN 1.0 07/26/2008 1444   NITRITE NEGATIVE 06/10/2016 1352   LEUKOCYTESUR NEGATIVE 06/10/2016 1352   Sepsis Labs: '@LABRCNTIP'$ (procalcitonin:4,lacticidven:4) ) Recent Results (from the past 240 hour(s))  Blood culture (routine x 2)     Status: None (Preliminary result)   Collection Time: 06/10/16  2:16 PM  Result Value Ref Range Status   Specimen Description BLOOD LEFT ANTECUBITAL  Final   Special Requests BOTTLES DRAWN AEROBIC AND ANAEROBIC 8CC  Final   Culture PENDING  Incomplete   Report Status PENDING  Incomplete     Radiological Exams on Admission: Dg Chest 1 View  Result Date: 06/10/2016 CLINICAL DATA:  Acute mental status change. EXAM: CHEST 1 VIEW COMPARISON:  September 28, 2009 FINDINGS: There is a focal infiltrate in the left base which demonstrates a somewhat rounded lateral margin. No other interval changes or acute abnormalities. IMPRESSION: Somewhat rounded focal infiltrate in the left base medially. If the patient has signs of infection, recommend treatment with short-term follow-up. If the patient does not have signs for infection, recommend CT imaging. Electronically Signed   By: Dorise Bullion III M.D   On: 06/10/2016 15:11   Ct Head Wo Contrast  Result Date: 06/10/2016 CLINICAL DATA:  71 year old with four-day history of generalized weakness, vomiting, dizziness and anorexia with multiple falls. Patient lives alone but has not been able to care for himself over the past 4 days. EXAM: CT HEAD WITHOUT CONTRAST TECHNIQUE: Contiguous axial images were obtained from the base of the skull through the vertex without intravenous contrast. COMPARISON:  MRI brain 06/06/2016, 05/31/2008. CT head 06/06/2016, 08/27/2010, 05/29/2008. FINDINGS: Brain: Severe cortical atrophy,  moderate deep atrophy and severe cerebellar atrophy, progressive since 2012. Encephalomalacia involving the left posterior frontal and superior temporal lobes at the site of the prior stroke, unchanged since 2012. Severe changes of small vessel disease of the white matter diffusely, progressive since 2012. No mass lesion. No midline shift. No acute hemorrhage or hematoma. No extra-axial fluid collections. No evidence of acute infarction. Vascular: Severe bilateral carotid siphon and mild left vertebral artery atherosclerosis. Skull: No skull fracture or other focal  osseous abnormality involving the skull. Sinuses/Orbits: Visualized paranasal sinuses, bilateral mastoid air cells and bilateral middle ear cavities well-aerated. Visualized orbits normal. Other: None. IMPRESSION: 1. No acute intracranial abnormality. 2. Severe generalized atrophy and severe chronic microvascular ischemic changes of the white matter diffusely, progressive since 2012. 3. 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 Electronically Signed   By: Evangeline Dakin M.D.   On: 06/10/2016 15:38   Ct Abdomen Pelvis W Contrast  Result Date: 06/10/2016 CLINICAL DATA:  71 year old male with history of weakness and vomiting. Dizziness. EXAM: CT ABDOMEN AND PELVIS WITH CONTRAST TECHNIQUE: Multidetector CT imaging of the abdomen and pelvis was performed using the standard protocol following bolus administration of intravenous contrast. CONTRAST:  46m ISOVUE-300 IOPAMIDOL (ISOVUE-300) INJECTION 61% COMPARISON:  CT the abdomen pelvis 04/24/2004. FINDINGS: Lower chest: Large mass centered in the left lower lobe incompletely visualized but measuring at least 9.2 x 7.3 cm (image 1 of series 6) highly concerning for primary bronchogenic carcinoma. Trace left pleural effusion, likely malignant. Atherosclerotic calcifications in the left circumflex and right coronary arteries. Hepatobiliary: A few tiny subcentimeter low-attenuation  lesions are noted in the liver, too small to definitively characterize. No large suspicious hepatic lesions are noted. No intra or extrahepatic biliary ductal dilatation. Gallbladder is unremarkable in appearance. Pancreas: No pancreatic mass. No pancreatic ductal dilatation. No pancreatic or peripancreatic fluid or inflammatory changes. Spleen: Calcified granulomas in the spleen. Adrenals/Urinary Tract: 13 mm low-attenuation lesion in the lower pole of the left kidney is compatible with a simple cyst. Right kidney and bilateral adrenal glands are normal in appearance. No hydroureteronephrosis. Urinary bladder is normal in appearance. Stomach/Bowel: The appearance of the stomach is normal. There is no pathologic dilatation of small bowel or colon. Long segment thickening of the colonic wall involving predominantly the descending colon, concerning for colitis. Vascular/Lymphatic: Aortic atherosclerosis, without evidence of aneurysm or dissection. 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 No lymphadenopathy noted in the abdomen or pelvis. Reproductive: Prostate gland and seminal vesicles are unremarkable in appearance. Other: No significant volume of ascites.  No pneumoperitoneum. Musculoskeletal: There are no aggressive appearing lytic or blastic lesions noted in the visualized portions of the skeleton. IMPRESSION: 1. Large incompletely visualized mass centered in the left lower lobe highly concerning for primary bronchogenic neoplasm. Trace left pleural effusion is likely malignant, suggesting M1a disease. Further evaluation with contrast enhanced chest CT is recommended at this time to better evaluate the full extent of thoracic disease. 2. No definite evidence of extra thoracic metastatic disease in the abdomen or pelvis. 3. There are tiny subcentimeter low-attenuation  lesions in the liver which are too small to definitively characterize. Metastatic lesions are not entirely excluded, but are not strongly favored. 4. Long segment colonic wall thickening of the descending colon, concerning for colitis. 5. Aortic atherosclerosis, in addition to at least 2 vessel coronary artery disease. Assessment for potential risk factor modification, dietary therapy or pharmacologic therapy may be warranted, if clinically indicated. 6. Additional incidental findings, as above. Electronically Signed   By: DVinnie LangtonM.D.   On: 06/10/2016 15:47    EKG: Independently reviewed. Sinus rhythm  Assessment/Plan  1. Sepsis secondary to colitis - Pt presents obtunded with marked leukocytosis, hypothermia, elevated lactate, AKI, and CT-findings consistent with colitis  - Obstructive PNA also considered as source, but respirations unlabored, no rhonchi appreciated, and no  hypoxia  - Blood cultures obtained in ED and incubating - 30 cc/kg NS given in ED  - With hx of penicillin allergy, empiric Flagyl and Levaquin initiated  - Continue empiric abx, trend lactate and pro-calcitonin; follow cultures and clinical response to therapy    2. Left lung mass - Incidental notation is made on CT abdomen of a large LLL mass, likely primary bronchogenic neoplasm and pleural effusion, likely malignant - Effusion does not look large enough to aspirate  - Contrast-enhanced chest CT once renal fxn improves  3. Acute encephalopathy  - Likely secondary to sepsis  - No focal deficits identified and head CT negative for acute abnormality  - Anticipate improvement with abx and fluid-resuscitation   4. Acute kidney injury  - SCr is 1.43 on admission, up from apparent baseline of 0.9  - Likely prerenal, possibly ATN in setting of sepsis  - Continue fluid-resuscitation, hold enalapril, repeat chem panel in am    5. Carotid artery occlusion  - Pt is s/p CEA and managed with warfarin  - Given the  recurrent falls, may need to reconsider the risk vs benefit of continued anticoagulation - Pt is currently obtunded and NPO, so warfarin is held    DVT prophylaxis: warfarin Code Status: Full  Family Communication: Brother updated at bedside  Disposition Plan: Admit to stepdown Consults called: None Admission status: Inpatient    Vianne Bulls, MD [facility] Pager [phone]  If 7PM-7AM, please contact night-coverage [email or web address] Password [facility]  06/10/2016, 4:54 PM

## 2016-06-10 NOTE — ED Notes (Signed)
Lab at bedside

## 2016-06-10 NOTE — ED Notes (Signed)
Pt had episode of urinary incontinence, bed linens changed and pt skin cleaned and dried.

## 2016-06-10 NOTE — ED Provider Notes (Signed)
[facility] DEPT Provider Note   CSN: [id number] Arrival date & time: 06/10/16  1309  By signing my name below, I, Hilbert Odor, attest that this documentation has been prepared under the direction and in the presence of Isla Pence, MD. Electronically Signed: Hilbert Odor, Scribe. 06/10/16. 1:29 PM. History   Chief Complaint Chief Complaint  Patient presents with  . Dizziness    weak     LEVEL 5 CAVEAT: Altered mental status The history is provided by a friend. No language interpreter was used.   HPI Comments: Frederick [name] is a 71 y.o. male who presents to the Emergency Department with weakness.  The pt was here on 1/3 and saw Dr. Lacinda Axon.  He said he had been vomiting, and has been dizzy.  The pt had a normal CT head and a normal MRI brain.  He was d/c home.  Since then, he has been unable to eat anything.   His family member and neighbor said that he has fallen multiple times.  He lives by himself.  He has not been able to dress himself.  He has not been able to take his medications.   Past Medical History:  Diagnosis Date  . Carotid artery occlusion   . Hypertension   . Stroke ([place]) 2005  . Vertigo     Patient Active Problem List   Diagnosis Date Noted  . Colitis 06/10/2016  . Occlusion and stenosis of carotid artery without mention of cerebral infarction 12/11/2011    Past Surgical History:  Procedure Laterality Date  . CAROTID ENDARTERECTOMY  2005   CVA  . CAROTID ENDARTERECTOMY  07/30/2008   Left CEA       Home Medications    Prior to Admission medications   Medication Sig Start Date End Date Taking? Authorizing Provider  amLODipine (NORVASC) 10 MG tablet Take 10 mg by mouth daily.     Yes Historical Provider, MD  enalapril (VASOTEC) 5 MG tablet Take 5 mg by mouth daily.     Yes Historical Provider, MD  ferrous sulfate 325 (65 FE) MG tablet Take 1 tablet by mouth daily. 05/18/16  Yes Historical Provider, MD  LORazepam (ATIVAN) 1 MG tablet Take 1  tablet (1 mg total) by mouth 3 (three) times daily as needed for anxiety (or restlessness). 06/06/16  Yes Nat Christen, MD  meclizine (ANTIVERT) 25 MG tablet Take 1 tablet (25 mg total) by mouth 3 (three) times daily as needed for dizziness. 06/06/16  Yes Nat Christen, MD  pravastatin (PRAVACHOL) 40 MG tablet Take 40 mg by mouth daily.     Yes Historical Provider, MD  buPROPion (ZYBAN) 150 MG 12 hr tablet One tab po qam for 3 days, then increase to one tab BID for 7 weeks Patient taking differently: Take 150 mg by mouth 2 (two) times daily. One tab po qam for 3 days, then increase to one tab BID for 7 weeks 05/21/11   Tammy Triplett, PA-C  COUMADIN 2.5 MG tablet Take 1 tablet by mouth daily. 03/15/16   Historical Provider, MD  warfarin (COUMADIN) 1 MG tablet Take 1 tablet by mouth daily. 05/14/16   Historical Provider, MD    Family History Family History  Problem Relation Age of Onset  . Diabetes Mother   . Heart disease Father   . Heart attack Father     Social History Social History  Substance Use Topics  . Smoking status: Current Every Day Smoker    Packs/day: 1.00    Types:  Cigarettes  . Smokeless tobacco: Never Used  . Alcohol use No     Allergies   Penicillins   Review of Systems Review of Systems  Unable to perform ROS: Mental status change  Cardiovascular: Negative for chest pain.  Gastrointestinal: Negative for abdominal pain.  Neurological: Positive for dizziness.  All other systems reviewed and are negative.    Physical Exam Updated Vital Signs BP 123/71   Pulse 87   Temp (!) 94.2 F (34.6 C) (Rectal)   Resp 20   Ht '5\' 10"'$  (1.778 m)   Wt 130 lb (59 kg)   SpO2 96%   BMI 18.65 kg/m   Physical Exam  Constitutional: He appears listless. He appears cachectic. He appears ill. He appears distressed.  HENT:  Head: Normocephalic and atraumatic.  Eyes: EOM are normal. Pupils are equal, round, and reactive to light.  Neck: Normal range of motion. Neck supple.    Cardiovascular: Normal rate and regular rhythm.  Exam reveals no gallop and no friction rub.   No murmur heard. Pulmonary/Chest: Effort normal and breath sounds normal. He has no wheezes. He has no rhonchi. He has no rales.  Abdominal: Soft. Bowel sounds are normal. There is no tenderness.  Musculoskeletal: Normal range of motion. He exhibits no tenderness.  Neurological: He appears listless. No cranial nerve deficit.  Oriented to person and place.  Slow to respond.  Skin: Skin is warm and dry.  Bruises all over arms  Psychiatric: He has a normal mood and affect. He is slowed.  Nursing note and vitals reviewed.    ED Treatments / Results  DIAGNOSTIC STUDIES: Oxygen Saturation is 100% on RA, normal by my interpretation.    COORDINATION OF CARE: 1:37 PM Discussed treatment plan with pt at bedside and pt agreed to plan.  Labs (all labs ordered are listed, but only abnormal results are displayed) Labs Reviewed  CBC WITH DIFFERENTIAL/PLATELET - Abnormal; Notable for the following:       Result Value   WBC 25.0 (*)    RDW 16.4 (*)    Neutro Abs 23.2 (*)    All other components within normal limits  COMPREHENSIVE METABOLIC PANEL - Abnormal; Notable for the following:    Glucose, Bld 236 (*)    BUN 27 (*)    Creatinine, Ser 1.43 (*)    Calcium 10.9 (*)    Albumin 3.2 (*)    ALT 14 (*)    GFR calc non Af Amer 48 (*)    GFR calc Af Amer 55 (*)    Anion gap 17 (*)    All other components within normal limits  RAPID URINE DRUG SCREEN, HOSP PERFORMED - Abnormal; Notable for the following:    Benzodiazepines POSITIVE (*)    All other components within normal limits  PROTIME-INR - Abnormal; Notable for the following:    Prothrombin Time 30.1 (*)    All other components within normal limits  URINALYSIS, ROUTINE W REFLEX MICROSCOPIC - Abnormal; Notable for the following:    Hgb urine dipstick SMALL (*)    Ketones, ur 20 (*)    Protein, ur 30 (*)    Bacteria, UA RARE (*)    All  other components within normal limits  I-STAT CG4 LACTIC ACID, ED - Abnormal; Notable for the following:    Lactic Acid, Venous 4.90 (*)    All other components within normal limits  CBG MONITORING, ED - Abnormal; Notable for the following:    Glucose-Capillary 208 (*)  All other components within normal limits  CULTURE, BLOOD (ROUTINE X 2)  CULTURE, BLOOD (ROUTINE X 2)  AMMONIA    EKG  EKG Interpretation  Date/Time:  Sunday June 10 2016 15:38:11 EST Ventricular Rate:  88 PR Interval:    QRS Duration: 76 QT Interval:  388 QTC Calculation: 470 R Axis:   79 Text Interpretation:  Sinus rhythm Consider right atrial enlargement Borderline low voltage, extremity leads Abnormal T, consider ischemia, anterior leads Baseline wander in lead(s) I III aVR aVL aVF Confirmed by Gilford Raid MD, Janisa Labus ([id number]) on 06/10/2016 3:42:14 PM       Radiology Dg Chest 1 View  Result Date: 06/10/2016 CLINICAL DATA:  Acute mental status change. EXAM: CHEST 1 VIEW COMPARISON:  September 28, 2009 FINDINGS: There is a focal infiltrate in the left base which demonstrates a somewhat rounded lateral margin. No other interval changes or acute abnormalities. IMPRESSION: Somewhat rounded focal infiltrate in the left base medially. If the patient has signs of infection, recommend treatment with short-term follow-up. If the patient does not have signs for infection, recommend CT imaging. Electronically Signed   By: Dorise Bullion III M.D   On: 06/10/2016 15:11   Ct Head Wo Contrast  Result Date: 06/10/2016 CLINICAL DATA:  71 year old with four-day history of generalized weakness, vomiting, dizziness and anorexia with multiple falls. Patient lives alone but has not been able to care for himself over the past 4 days. EXAM: CT HEAD WITHOUT CONTRAST TECHNIQUE: Contiguous axial images were obtained from the base of the skull through the vertex without intravenous contrast. COMPARISON:  MRI brain 06/06/2016, 05/31/2008. CT head  06/06/2016, 08/27/2010, 05/29/2008. FINDINGS: Brain: Severe cortical atrophy, moderate deep atrophy and severe cerebellar atrophy, progressive since 2012. Encephalomalacia involving the left posterior frontal and superior temporal lobes at the site of the prior stroke, unchanged since 2012. Severe changes of small vessel disease of the white matter diffusely, progressive since 2012. No mass lesion. No midline shift. No acute hemorrhage or hematoma. No extra-axial fluid collections. No evidence of acute infarction. Vascular: Severe bilateral carotid siphon and mild left vertebral artery atherosclerosis. Skull: No skull fracture or other focal osseous abnormality involving the skull. Sinuses/Orbits: Visualized paranasal sinuses, bilateral mastoid air cells and bilateral middle ear cavities well-aerated. Visualized orbits normal. Other: None. IMPRESSION: 1. No acute intracranial abnormality. 2. Severe generalized atrophy and severe chronic microvascular ischemic changes of the white matter diffusely, progressive since 2012. 3. 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 Electronically Signed   By: Evangeline Dakin M.D.   On: 06/10/2016 15:38   Ct Abdomen Pelvis W Contrast  Result Date: 06/10/2016 CLINICAL DATA:  71 year old male with history of weakness and vomiting. Dizziness. EXAM: CT ABDOMEN AND PELVIS WITH CONTRAST TECHNIQUE: Multidetector CT imaging of the abdomen and pelvis was performed using the standard protocol following bolus administration of intravenous contrast. CONTRAST:  59m ISOVUE-300 IOPAMIDOL (ISOVUE-300) INJECTION 61% COMPARISON:  CT the abdomen pelvis 04/24/2004. FINDINGS: Lower chest: Large mass centered in the left lower lobe incompletely visualized but measuring at least 9.2 x 7.3 cm (image 1 of series 6) highly concerning for primary bronchogenic carcinoma. Trace left pleural effusion, likely malignant. Atherosclerotic calcifications in the left circumflex and  right coronary arteries. Hepatobiliary: A few tiny subcentimeter low-attenuation lesions are noted in the liver, too small to definitively characterize. No large suspicious hepatic lesions are noted. No intra or extrahepatic biliary ductal dilatation. Gallbladder is unremarkable in appearance. Pancreas: No pancreatic mass. No pancreatic ductal dilatation.  No pancreatic or peripancreatic fluid or inflammatory changes. Spleen: Calcified granulomas in the spleen. Adrenals/Urinary Tract: 13 mm low-attenuation lesion in the lower pole of the left kidney is compatible with a simple cyst. Right kidney and bilateral adrenal glands are normal in appearance. No hydroureteronephrosis. Urinary bladder is normal in appearance. Stomach/Bowel: The appearance of the stomach is normal. There is no pathologic dilatation of small bowel or colon. Long segment thickening of the colonic wall involving predominantly the descending colon, concerning for colitis. Vascular/Lymphatic: Aortic atherosclerosis, without evidence of aneurysm or dissection. 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 No lymphadenopathy noted in the abdomen or pelvis. Reproductive: Prostate gland and seminal vesicles are unremarkable in appearance. Other: No significant volume of ascites.  No pneumoperitoneum. Musculoskeletal: There are no aggressive appearing lytic or blastic lesions noted in the visualized portions of the skeleton. IMPRESSION: 1. Large incompletely visualized mass centered in the left lower lobe highly concerning for primary bronchogenic neoplasm. Trace left pleural effusion is likely malignant, suggesting M1a disease. Further evaluation with contrast enhanced chest CT is recommended at this time to better evaluate the full extent of thoracic disease. 2. No definite evidence of extra thoracic metastatic  disease in the abdomen or pelvis. 3. There are tiny subcentimeter low-attenuation lesions in the liver which are too small to definitively characterize. Metastatic lesions are not entirely excluded, but are not strongly favored. 4. Long segment colonic wall thickening of the descending colon, concerning for colitis. 5. Aortic atherosclerosis, in addition to at least 2 vessel coronary artery disease. Assessment for potential risk factor modification, dietary therapy or pharmacologic therapy may be warranted, if clinically indicated. 6. Additional incidental findings, as above. Electronically Signed   By: Vinnie Langton M.D.   On: 06/10/2016 15:47    Procedures Procedures (including critical care time)  Medications Ordered in ED Medications  sodium chloride 0.9 % bolus 1,000 mL (0 mLs Intravenous Stopped 06/10/16 1535)    And  0.9 %  sodium chloride infusion ( Intravenous New Bag/Given 06/10/16 1535)  metroNIDAZOLE (FLAGYL) IVPB 500 mg (500 mg Intravenous New Bag/Given 06/10/16 1559)  levofloxacin (LEVAQUIN) IVPB 750 mg (not administered)  sodium chloride 0.9 % bolus 1,000 mL (1,000 mLs Intravenous New Bag/Given 06/10/16 1424)  iopamidol (ISOVUE-300) 61 % injection 75 mL (75 mLs Intravenous Contrast Given 06/10/16 1509)  sodium chloride 0.9 % bolus 1,000 mL (1,000 mLs Intravenous New Bag/Given 06/10/16 1558)     Initial Impression / Assessment and Plan / ED Course  I have reviewed the triage vital signs and the nursing notes.  Pertinent labs & imaging results that were available during my care of the patient were reviewed by me and considered in my medical decision making (see chart for details).  Clinical Course    Pt given IVFs.  He was placed on the bair hugger.  He was treated with levaquin and flagyl for his colitis.  Pt and family told of the cancer.   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 The recorded information has been reviewed and is accurate.     Isla Pence, MD 06/10/16 [phone]

## 2016-06-11 ENCOUNTER — Inpatient Hospital Stay (HOSPITAL_COMMUNITY): Payer: Medicare Other

## 2016-06-11 LAB — COMPREHENSIVE METABOLIC PANEL
ALK PHOS: 56 U/L (ref 38–126)
ALT: 9 U/L — ABNORMAL LOW (ref 17–63)
AST: 28 U/L (ref 15–41)
Albumin: 2.1 g/dL — ABNORMAL LOW (ref 3.5–5.0)
Anion gap: 7 (ref 5–15)
BILIRUBIN TOTAL: 0.6 mg/dL (ref 0.3–1.2)
BUN: 19 mg/dL (ref 6–20)
CALCIUM: 8.7 mg/dL — AB (ref 8.9–10.3)
CO2: 27 mmol/L (ref 22–32)
Chloride: 109 mmol/L (ref 101–111)
Creatinine, Ser: 0.94 mg/dL (ref 0.61–1.24)
GFR calc Af Amer: >60 mL/min (ref >60)
Glucose, Bld: 90 mg/dL (ref 65–99)
POTASSIUM: 2.9 mmol/L — AB (ref 3.5–5.1)
Sodium: 143 mmol/L (ref 135–145)
Total Protein: 5.2 g/dL — ABNORMAL LOW (ref 6.5–8.1)

## 2016-06-11 LAB — CBC WITH DIFFERENTIAL/PLATELET
BASOS ABS: 0 10*3/uL (ref 0.0–0.1)
Basophils Relative: 0 %
Eosinophils Absolute: 0.1 10*3/uL (ref 0.0–0.7)
Eosinophils Relative: 1 %
HEMATOCRIT: 32.5 % — AB (ref 39.0–52.0)
HEMOGLOBIN: 10.4 g/dL — AB (ref 13.0–17.0)
LYMPHS PCT: 6 %
Lymphs Abs: 0.8 10*3/uL (ref 0.7–4.0)
MCH: 26.8 pg (ref 26.0–34.0)
MCHC: 32 g/dL (ref 30.0–36.0)
MCV: 83.8 fL (ref 78.0–100.0)
MONO ABS: 1.1 10*3/uL — AB (ref 0.1–1.0)
Monocytes Relative: 8 %
NEUTROS ABS: 11.5 10*3/uL — AB (ref 1.7–7.7)
Neutrophils Relative %: 85 %
Platelets: 258 10*3/uL (ref 150–400)
RBC: 3.88 MIL/uL — ABNORMAL LOW (ref 4.22–5.81)
RDW: 16.8 % — AB (ref 11.5–15.5)
WBC: 13.5 10*3/uL — ABNORMAL HIGH (ref 4.0–10.5)

## 2016-06-11 LAB — GLUCOSE, CAPILLARY
GLUCOSE-CAPILLARY: 84 mg/dL (ref 65–99)
Glucose-Capillary: 80 mg/dL (ref 65–99)
Glucose-Capillary: 115 mg/dL — ABNORMAL HIGH (ref 65–99)
Glucose-Capillary: 93 mg/dL (ref 65–99)

## 2016-06-11 LAB — PROTIME-INR
INR: 3.34
Prothrombin Time: 34.6 seconds — ABNORMAL HIGH (ref 11.4–15.2)

## 2016-06-11 MED ORDER — ADULT MULTIVITAMIN W/MINERALS CH
1.0000 | ORAL_TABLET | Freq: Every day | ORAL | Status: DC
  Administered 2016-06-11 – 2016-06-19 (×9): 1 via ORAL
  Filled 2016-06-11 (×10): qty 1

## 2016-06-11 MED ORDER — POTASSIUM CHLORIDE 10 MEQ/100ML IV SOLN
10.0000 meq | INTRAVENOUS | Status: AC
  Administered 2016-06-11 (×4): 10 meq via INTRAVENOUS
  Filled 2016-06-11 (×5): qty 100

## 2016-06-11 MED ORDER — LEVOFLOXACIN IN D5W 750 MG/150ML IV SOLN
750.0000 mg | INTRAVENOUS | Status: DC
  Administered 2016-06-11 – 2016-06-15 (×5): 750 mg via INTRAVENOUS
  Filled 2016-06-11 (×5): qty 150

## 2016-06-11 MED ORDER — IOPAMIDOL (ISOVUE-300) INJECTION 61%
75.0000 mL | Freq: Once | INTRAVENOUS | Status: AC | PRN
  Administered 2016-06-11: 75 mL via INTRAVENOUS

## 2016-06-11 MED ORDER — ENSURE ENLIVE PO LIQD
237.0000 mL | Freq: Two times a day (BID) | ORAL | Status: DC
  Administered 2016-06-12 – 2016-06-20 (×12): 237 mL via ORAL

## 2016-06-11 MED ORDER — IPRATROPIUM-ALBUTEROL 0.5-2.5 (3) MG/3ML IN SOLN
3.0000 mL | Freq: Four times a day (QID) | RESPIRATORY_TRACT | Status: DC
  Administered 2016-06-11 (×3): 3 mL via RESPIRATORY_TRACT
  Filled 2016-06-11 (×3): qty 3

## 2016-06-11 MED ORDER — IPRATROPIUM-ALBUTEROL 0.5-2.5 (3) MG/3ML IN SOLN
3.0000 mL | Freq: Three times a day (TID) | RESPIRATORY_TRACT | Status: DC
  Administered 2016-06-12: 3 mL via RESPIRATORY_TRACT
  Filled 2016-06-11: qty 3

## 2016-06-11 NOTE — Consult Note (Signed)
Consult requested by: Dr. Lorriane Shire Consult requested for lung mass:  HPI: This is a 71 year old who came to the emergency department after being found poorly responsive at home by his brother. History is from the medical record. The patient is apparently been having multiple falls for the last month he's not been eating well not been taking his medications and has lost approximately 30 pounds. He has also had repeated nausea and vomiting. He has been in the floor on multiple occasions he was poorly responsive and brought to the emergency room. He was hypothermic in the ER. He was felt to be septic possibly from colitis. He was found to have acute kidney injury. He has been placed on sepsis protocol given large volumes of IV fluids his blood pressure is better his core temperature is better. CT of the abdomen and pelvis was remarkable for a left lower lobe mass suggestive of a bronchogenic carcinoma and also of colitis of the descending colon. His lactic acid was 4.9. White blood count was 25,000. When I went in to see him he looks at me but doesn't answer any questions  Past Medical History:  Diagnosis Date  . Carotid artery occlusion   . Hypertension   . Stroke ([place]) 2005  . Vertigo      Family History  Problem Relation Age of Onset  . Diabetes Mother   . Heart disease Father   . Heart attack Father      Social History   Social History  . Marital status: Single    Spouse name: N/A  . Number of children: N/A  . Years of education: N/A   Social History Main Topics  . Smoking status: Current Every Day Smoker    Packs/day: 1.00    Types: Cigarettes  . Smokeless tobacco: Never Used  . Alcohol use No  . Drug use: No  . Sexual activity: Not Currently   Other Topics Concern  . None   Social History Narrative  . None     ROS: Not obtainable    Objective: Vital signs in last 24 hours: Temp:  [94.2 F (34.6 C)-98.4 F (36.9 C)] 97.8 F (36.6 C) (01/08 0400) Pulse Rate:   [84-90] 84 (01/07 1744) Resp:  [13-20] 13 (01/07 1705) BP: (100-126)/(66-81) 101/72 (01/07 1808) SpO2:  [94 %-100 %] 94 % (01/07 1744) Weight:  [51.6 kg (113 lb 12.1 oz)-59 kg (130 lb)] 53.5 kg (117 lb 15.1 oz) (01/08 0500) Weight change:  Last BM Date: 06/10/16  Intake/Output from previous day: 01/07 0701 - 01/08 0700 In: 3150 [I.V.:3000; IV Piggyback:150] Out: 1600 [Urine:1600]  PHYSICAL EXAM Constitutional: He is awake he looks at me but he does not engage in conversation. He is mildly cachectic. Eyes: Pupils react ears nose mouth and throat: His mucous membranes are dry. Cardiovascular: His heart is regular with normal heart sounds no edema. Respiratory: Respiratory effort is normal. His lungs are clear gastrointestinal: His abdomen is minimally tender somewhat hypoactive bowel sounds. Skin: Cool but dry neurological: No focal abnormalities but complete examination could not be done. Psychiatric: Unable to assess  Lab Results: Basic Metabolic Panel:  Recent Labs  06/10/16 1337 06/11/16 0418  NA 142 143  K 3.6 2.9*  CL 101 109  CO2 24 27  GLUCOSE 236* 90  BUN 27* 19  CREATININE 1.43* 0.94  CALCIUM 10.9* 8.7*   Liver Function Tests:  Recent Labs  06/10/16 1337 06/11/16 0418  AST 36 28  ALT 14* 9*  ALKPHOS  85 56  BILITOT 1.0 0.6  PROT 7.9 5.2*  ALBUMIN 3.2* 2.1*   No results for input(s): LIPASE, AMYLASE in the last 72 hours.  Recent Labs  06/10/16 1338  AMMONIA 11   CBC:  Recent Labs  06/10/16 1337 06/11/16 0418  WBC 25.0* 13.5*  NEUTROABS 23.2* 11.5*  HGB 13.4 10.4*  HCT 42.3 32.5*  MCV 83.8 83.8  PLT 392 258   Cardiac Enzymes: No results for input(s): CKTOTAL, CKMB, CKMBINDEX, TROPONINI in the last 72 hours. BNP: No results for input(s): PROBNP in the last 72 hours. D-Dimer: No results for input(s): DDIMER in the last 72 hours. CBG:  Recent Labs  06/10/16 1348 06/10/16 1851 06/10/16 2037  GLUCAP 208* 83 84   Hemoglobin A1C: No  results for input(s): HGBA1C in the last 72 hours. Fasting Lipid Panel: No results for input(s): CHOL, HDL, LDLCALC, TRIG, CHOLHDL, LDLDIRECT in the last 72 hours. Thyroid Function Tests: No results for input(s): TSH, T4TOTAL, FREET4, T3FREE, THYROIDAB in the last 72 hours. Anemia Panel: No results for input(s): VITAMINB12, FOLATE, FERRITIN, TIBC, IRON, RETICCTPCT in the last 72 hours. Coagulation:  Recent Labs  06/10/16 1337 06/11/16 0418  LABPROT 30.1* 34.6*  INR 2.80 3.34   Urine Drug Screen: Drugs of Abuse     Component Value Date/Time   LABOPIA NONE DETECTED 06/10/2016 1352   COCAINSCRNUR NONE DETECTED 06/10/2016 1352   LABBENZ POSITIVE (A) 06/10/2016 1352   AMPHETMU NONE DETECTED 06/10/2016 1352   THCU NONE DETECTED 06/10/2016 1352   LABBARB NONE DETECTED 06/10/2016 1352    Alcohol Level: No results for input(s): ETH in the last 72 hours. Urinalysis:  Recent Labs  06/10/16 1352  COLORURINE YELLOW  LABSPEC 1.020  PHURINE 5.0  GLUCOSEU NEGATIVE  HGBUR SMALL*  BILIRUBINUR NEGATIVE  KETONESUR 20*  PROTEINUR 30*  NITRITE NEGATIVE  LEUKOCYTESUR NEGATIVE   Misc. Labs:   ABGS: No results for input(s): PHART, PO2ART, TCO2, HCO3 in the last 72 hours.  Invalid input(s): PCO2   MICROBIOLOGY: Recent Results (from the past 240 hour(s))  Blood culture (routine x 2)     Status: None (Preliminary result)   Collection Time: 06/10/16  2:16 PM  Result Value Ref Range Status   Specimen Description BLOOD LEFT ANTECUBITAL  Final   Special Requests BOTTLES DRAWN AEROBIC AND ANAEROBIC 8CC  Final   Culture PENDING  Incomplete   Report Status PENDING  Incomplete  MRSA PCR Screening     Status: None   Collection Time: 06/10/16  5:17 PM  Result Value Ref Range Status   MRSA by PCR NEGATIVE NEGATIVE Final    Comment:        The GeneXpert MRSA Assay (FDA approved for NASAL specimens only), is one component of a comprehensive MRSA colonization surveillance program. It is  not intended to diagnose MRSA infection nor to guide or monitor treatment for MRSA infections.     Studies/Results: Dg Chest 1 View  Result Date: 06/10/2016 CLINICAL DATA:  Acute mental status change. EXAM: CHEST 1 VIEW COMPARISON:  September 28, 2009 FINDINGS: There is a focal infiltrate in the left base which demonstrates a somewhat rounded lateral margin. No other interval changes or acute abnormalities. IMPRESSION: Somewhat rounded focal infiltrate in the left base medially. If the patient has signs of infection, recommend treatment with short-term follow-up. If the patient does not have signs for infection, recommend CT imaging. Electronically Signed   By: Dorise Bullion III M.D   On: 06/10/2016 15:11  Ct Head Wo Contrast  Result Date: 06/10/2016 CLINICAL DATA:  71 year old with four-day history of generalized weakness, vomiting, dizziness and anorexia with multiple falls. Patient lives alone but has not been able to care for himself over the past 4 days. EXAM: CT HEAD WITHOUT CONTRAST TECHNIQUE: Contiguous axial images were obtained from the base of the skull through the vertex without intravenous contrast. COMPARISON:  MRI brain 06/06/2016, 05/31/2008. CT head 06/06/2016, 08/27/2010, 05/29/2008. FINDINGS: Brain: Severe cortical atrophy, moderate deep atrophy and severe cerebellar atrophy, progressive since 2012. Encephalomalacia involving the left posterior frontal and superior temporal lobes at the site of the prior stroke, unchanged since 2012. Severe changes of small vessel disease of the white matter diffusely, progressive since 2012. No mass lesion. No midline shift. No acute hemorrhage or hematoma. No extra-axial fluid collections. No evidence of acute infarction. Vascular: Severe bilateral carotid siphon and mild left vertebral artery atherosclerosis. Skull: No skull fracture or other focal osseous abnormality involving the skull. Sinuses/Orbits: Visualized paranasal sinuses, bilateral  mastoid air cells and bilateral middle ear cavities well-aerated. Visualized orbits normal. Other: None. IMPRESSION: 1. No acute intracranial abnormality. 2. Severe generalized atrophy and severe chronic microvascular ischemic changes of the white matter diffusely, progressive since 2012. 3. 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 Electronically Signed   By: Evangeline Dakin M.D.   On: 06/10/2016 15:38   Ct Abdomen Pelvis W Contrast  Result Date: 06/10/2016 CLINICAL DATA:  71 year old male with history of weakness and vomiting. Dizziness. EXAM: CT ABDOMEN AND PELVIS WITH CONTRAST TECHNIQUE: Multidetector CT imaging of the abdomen and pelvis was performed using the standard protocol following bolus administration of intravenous contrast. CONTRAST:  40m ISOVUE-300 IOPAMIDOL (ISOVUE-300) INJECTION 61% COMPARISON:  CT the abdomen pelvis 04/24/2004. FINDINGS: Lower chest: Large mass centered in the left lower lobe incompletely visualized but measuring at least 9.2 x 7.3 cm (image 1 of series 6) highly concerning for primary bronchogenic carcinoma. Trace left pleural effusion, likely malignant. Atherosclerotic calcifications in the left circumflex and right coronary arteries. Hepatobiliary: A few tiny subcentimeter low-attenuation lesions are noted in the liver, too small to definitively characterize. No large suspicious hepatic lesions are noted. No intra or extrahepatic biliary ductal dilatation. Gallbladder is unremarkable in appearance. Pancreas: No pancreatic mass. No pancreatic ductal dilatation. No pancreatic or peripancreatic fluid or inflammatory changes. Spleen: Calcified granulomas in the spleen. Adrenals/Urinary Tract: 13 mm low-attenuation lesion in the lower pole of the left kidney is compatible with a simple cyst. Right kidney and bilateral adrenal glands are normal in appearance. No hydroureteronephrosis. Urinary bladder is normal in appearance. Stomach/Bowel: The  appearance of the stomach is normal. There is no pathologic dilatation of small bowel or colon. Long segment thickening of the colonic wall involving predominantly the descending colon, concerning for colitis. Vascular/Lymphatic: Aortic atherosclerosis, without evidence of aneurysm or dissection. 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 No lymphadenopathy noted in the abdomen or pelvis. Reproductive: Prostate gland and seminal vesicles are unremarkable in appearance. Other: No significant volume of ascites.  No pneumoperitoneum. Musculoskeletal: There are no aggressive appearing lytic or blastic lesions noted in the visualized portions of the skeleton. IMPRESSION: 1. Large incompletely visualized mass centered in the left lower lobe highly concerning for primary bronchogenic neoplasm. Trace left pleural effusion is likely malignant, suggesting M1a disease. Further evaluation with contrast enhanced chest CT is recommended at this  time to better evaluate the full extent of thoracic disease. 2. No definite evidence of extra thoracic metastatic disease in the abdomen or pelvis. 3. There are tiny subcentimeter low-attenuation lesions in the liver which are too small to definitively characterize. Metastatic lesions are not entirely excluded, but are not strongly favored. 4. Long segment colonic wall thickening of the descending colon, concerning for colitis. 5. Aortic atherosclerosis, in addition to at least 2 vessel coronary artery disease. Assessment for potential risk factor modification, dietary therapy or pharmacologic therapy may be warranted, if clinically indicated. 6. Additional incidental findings, as above. Electronically Signed   By: Vinnie Langton M.D.   On: 06/10/2016 15:47    Medications:  Prior to Admission:  Prescriptions Prior to Admission  Medication  Sig Dispense Refill Last Dose  . amLODipine (NORVASC) 10 MG tablet Take 10 mg by mouth daily.     Past Week at Unknown time  . enalapril (VASOTEC) 5 MG tablet Take 5 mg by mouth daily.     Past Week at Unknown time  . ferrous sulfate 325 (65 FE) MG tablet Take 1 tablet by mouth daily.  5 Past Week at Unknown time  . LORazepam (ATIVAN) 1 MG tablet Take 1 tablet (1 mg total) by mouth 3 (three) times daily as needed for anxiety (or restlessness). 15 tablet 0 Past Week at Unknown time  . meclizine (ANTIVERT) 25 MG tablet Take 1 tablet (25 mg total) by mouth 3 (three) times daily as needed for dizziness. 15 tablet 0 Past Week at Unknown time  . pravastatin (PRAVACHOL) 40 MG tablet Take 40 mg by mouth daily.     Past Week at Unknown time  . buPROPion (ZYBAN) 150 MG 12 hr tablet One tab po qam for 3 days, then increase to one tab BID for 7 weeks (Patient taking differently: Take 150 mg by mouth 2 (two) times daily. One tab po qam for 3 days, then increase to one tab BID for 7 weeks) 30 tablet 0 Past Week at Unknown time  . COUMADIN 2.5 MG tablet Take 1 tablet by mouth daily.     Marland Kitchen warfarin (COUMADIN) 1 MG tablet Take 1 tablet by mouth daily.   Past Week at Unknown time   Scheduled: . Influenza vac split quadrivalent PF  0.5 mL Intramuscular Tomorrow-1000  . insulin aspart  0-9 Units Subcutaneous TID WC  . ipratropium-albuterol  3 mL Nebulization Q6H  . [START ON 06/12/2016] levofloxacin (LEVAQUIN) IV  750 mg Intravenous Q48H  . metroNIDAZOLE  500 mg Intravenous Q8H  . pneumococcal 23 valent vaccine  0.5 mL Intramuscular Tomorrow-1000  . potassium chloride  10 mEq Intravenous Q1 Hr x 4   Continuous:  ZOX:[id number] **OR** acetaminophen, bisacodyl, ondansetron **OR** ondansetron (ZOFRAN) IV, polyethylene glycol  Assesment: He was admitted with sepsis presumably from colitis. He is being treated for that. His lactate has improved. He was hypothermic on admission and that's better. He has acute  encephalopathy likely related to his medical illness. He has a lung mass that looks like it may be primary malignancy but that is on a CT of the abdomen and pelvis. He has a small left pleural effusion. I have personally reviewed the CT. Principal Problem:   Sepsis ([place]) Active Problems:   Carotid artery occlusion   Colitis   Mass of left lung   AKI (acute kidney injury) ([place])   Acute encephalopathy   Hypothermia    Plan: CT chest with contrast. Continue with other treatments.  LOS: 1 day   Kadejah Sandiford L 06/11/2016, 7:17 AM

## 2016-06-11 NOTE — Care Management Important Message (Signed)
Important Message  Patient Details  Name: Frederick [name] MRN: [id number] Date of Birth: 10-Nov-1945   Medicare Important Message Given:  Yes    Ariadna Setter, Chauncey Reading, RN 06/11/2016, 1:21 PM

## 2016-06-11 NOTE — Plan of Care (Signed)
Problem: Safety: Goal: Ability to remain free from injury will improve Outcome: Progressing Patient understands how to use call bell and calls out for assistance.

## 2016-06-11 NOTE — Progress Notes (Signed)
Initial Nutrition Assessment  DOCUMENTATION CODES:  Pt meets criteria for severe MALNUTRITION in the context of chronic illness as evidenced by moderate muscle and fat depletion.   Underweight      INTERVENTION:  Ensure Enlive po BID, each supplement provides 350 kcal and 20 grams of protein   Recommend OT assess   NUTRITION DIAGNOSIS:     Malnutrition (Severe) related to poor appetite   as evidenced by   pt social hx, BMI  and CT findings lung mass (catabolic illness)   GOAL:  Pt to meet >/= 90% of their estimated nutrition needs      MONITOR: Percent Po intake, labs and wt trends     REASON FOR ASSESSMENT:   Malnutrition Screening Tool    ASSESSMENT:  Per MD: Frederick [name] is a 71 y.o. male with medical history significant for hypertension and carotid artery occlusion with remote CVA status post CEA, now presenting to the emergency department after being found on the floor and poorly responsive by his brother. History is obtained through the patient's brother who also lives next door to him, and through discussion with the ED personnel and review of the medical record. Patient has been suffering recurrent falls for the past month, has reportedly not been eating much at all or taking his medications over this interval, and has lost 30 pounds.  CT findings left lung mass.    Patient is having difficulty feeding himself today. He is unable to recall what he usually eats. Expect his intake has been limited based on hx of falls, increased weakness, dehydration on admission and  his living situation.   His weight hx shows weight loss trend over the past 2 years.   Nutrition-Focused physical exam findings are moderate fat depletion,  moderate  muscle depletion, and no  edema.     Recent Labs Lab 06/06/16 1113 06/10/16 1337 06/11/16 0418  NA 139 142 143  K 3.3* 3.6 2.9*  CL 99* 101 109  CO2 '28 24 27  '$ BUN 15 27* 19  CREATININE 0.96 1.43* 0.94  CALCIUM 10.5* 10.9* 8.7*   GLUCOSE 123* 236* 90   Labs: potassium 2.9  Diet Order:  Diet Heart Room service appropriate? Yes; Fluid consistency: Thin  Skin:  Reviewed, no issues  Last BM:  06/10/16  Height:   Ht Readings from Last 1 Encounters:  06/10/16 '5\' 10"'$  (1.778 m)    Weight:   Wt Readings from Last 1 Encounters:  06/11/16 117 lb 15.1 oz (53.5 kg)    Ideal Body Weight:  75 kg  BMI:  Body mass index is 16.92 kg/m.  Estimated Nutritional Needs:   Kcal:  1890-2160  Protein:  80-85 gr  Fluid:  1.6 liters   EDUCATION NEEDS:  None identified at this time    Colman Cater MS,RD,CSG,LDN Office: #[phone] Pager: [phone]

## 2016-06-11 NOTE — Progress Notes (Signed)
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 Patient was found recurrently on flow by his brother living next door patient does not imbibe alcohol. Placed on Levaquin and Flagyl with improvement in leukocytosis as well as lactic acid pulmonology as well as gastroenterology consults are requested for the left lower lobe mass as well as presumed colitis contributing to sepsis NUEL DEJAYNES ZYS:[id number] DOB: May 09, 1946 DOA: 06/10/2016 PCP: Maricela Curet, MD   Physical Exam: Blood pressure 101/72, pulse 84, temperature 97.8 F (36.6 C), temperature source Oral, resp. rate 13, height _0  (1.778 m), weight 53.5 kg (117 lb 15.1 oz), SpO2 94 %. Lungs show diminished breath sounds in bases prolonged inspiratory  Phase no rales no wheezes appreciable heart regular rhythm no S3-S4 no heaves thrills rubs abdomen soft nontender bowel sounds normoactive no guarding or rebound masses no megaly   Investigations:  Recent Results (from the past 240 hour(s))  Blood culture (routine x 2)     Status: None (Preliminary result)   Collection Time: 06/10/16  2:16 PM  Result Value Ref Range Status   Specimen Description BLOOD LEFT ANTECUBITAL  Final   Special Requests BOTTLES DRAWN AEROBIC AND ANAEROBIC 8CC  Final   Culture PENDING  Incomplete   Report Status PENDING  Incomplete  MRSA PCR Screening     Status: None   Collection Time: 06/10/16  5:17 PM  Result Value Ref Range Status   MRSA by PCR NEGATIVE NEGATIVE Final    Comment:        The GeneXpert MRSA Assay (FDA approved for NASAL specimens only), is one component of a comprehensive MRSA colonization surveillance program. It is not intended to diagnose  MRSA infection nor to guide or monitor treatment for MRSA infections.      Basic Metabolic Panel:  Recent Labs  06/10/16 1337 06/11/16 0418  NA 142 143  K 3.6 2.9*  CL 101 109  CO2 24 27  GLUCOSE 236* 90  BUN 27* 19  CREATININE 1.43* 0.94  CALCIUM 10.9* 8.7*   Liver Function Tests:  Recent Labs  06/10/16 1337 06/11/16 0418  AST 36 28  ALT 14* 9*  ALKPHOS 85 56  BILITOT 1.0 0.6  PROT 7.9 5.2*  ALBUMIN 3.2* 2.1*     CBC:  Recent Labs  06/10/16 1337 06/11/16 0418  WBC 25.0* 13.5*  NEUTROABS 23.2* 11.5*  HGB 13.4 10.4*  HCT 42.3 32.5*  MCV 83.8 83.8  PLT 392 258    Dg Chest 1 View  Result Date: 06/10/2016 CLINICAL DATA:  Acute mental status change. EXAM: CHEST 1 VIEW COMPARISON:  September 28, 2009 FINDINGS: There is a focal infiltrate in the left base which demonstrates a somewhat rounded lateral margin. No other interval changes or acute abnormalities. IMPRESSION: Somewhat rounded focal infiltrate in the left base medially. If the patient has signs of infection, recommend treatment with short-term follow-up. If the patient does not have signs for infection, recommend CT imaging. Electronically Signed   By: Dorise Bullion III M.D   On: 06/10/2016 15:11   Ct Head Wo Contrast  Result Date: 06/10/2016 CLINICAL DATA:  71 year old with four-day history of generalized weakness, vomiting, dizziness and anorexia with multiple falls. Patient lives alone but has not been  able to care for himself over the past 4 days. EXAM: CT HEAD WITHOUT CONTRAST TECHNIQUE: Contiguous axial images were obtained from the base of the skull through the vertex without intravenous contrast. COMPARISON:  MRI brain 06/06/2016, 05/31/2008. CT head 06/06/2016, 08/27/2010, 05/29/2008. FINDINGS: Brain: Severe cortical atrophy, moderate deep atrophy and severe cerebellar atrophy, progressive since 2012. Encephalomalacia involving the left posterior frontal and superior temporal lobes at the site of the  prior stroke, unchanged since 2012. Severe changes of small vessel disease of the white matter diffusely, progressive since 2012. No mass lesion. No midline shift. No acute hemorrhage or hematoma. No extra-axial fluid collections. No evidence of acute infarction. Vascular: Severe bilateral carotid siphon and mild left vertebral artery atherosclerosis. Skull: No skull fracture or other focal osseous abnormality involving the skull. Sinuses/Orbits: Visualized paranasal sinuses, bilateral mastoid air cells and bilateral middle ear cavities well-aerated. Visualized orbits normal. Other: None. IMPRESSION: 1. No acute intracranial abnormality. 2. Severe generalized atrophy and severe chronic microvascular ischemic changes of the white matter diffusely, progressive since 2012. 3. 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 Electronically Signed   By: Evangeline Dakin M.D.   On: 06/10/2016 15:38   Ct Abdomen Pelvis W Contrast  Result Date: 06/10/2016 CLINICAL DATA:  71 year old male with history of weakness and vomiting. Dizziness. EXAM: CT ABDOMEN AND PELVIS WITH CONTRAST TECHNIQUE: Multidetector CT imaging of the abdomen and pelvis was performed using the standard protocol following bolus administration of intravenous contrast. CONTRAST:  102m ISOVUE-300 IOPAMIDOL (ISOVUE-300) INJECTION 61% COMPARISON:  CT the abdomen pelvis 04/24/2004. FINDINGS: Lower chest: Large mass centered in the left lower lobe incompletely visualized but measuring at least 9.2 x 7.3 cm (image 1 of series 6) highly concerning for primary bronchogenic carcinoma. Trace left pleural effusion, likely malignant. Atherosclerotic calcifications in the left circumflex and right coronary arteries. Hepatobiliary: A few tiny subcentimeter low-attenuation lesions are noted in the liver, too small to definitively characterize. No large suspicious hepatic lesions are noted. No intra or extrahepatic biliary ductal dilatation.  Gallbladder is unremarkable in appearance. Pancreas: No pancreatic mass. No pancreatic ductal dilatation. No pancreatic or peripancreatic fluid or inflammatory changes. Spleen: Calcified granulomas in the spleen. Adrenals/Urinary Tract: 13 mm low-attenuation lesion in the lower pole of the left kidney is compatible with a simple cyst. Right kidney and bilateral adrenal glands are normal in appearance. No hydroureteronephrosis. Urinary bladder is normal in appearance. Stomach/Bowel: The appearance of the stomach is normal. There is no pathologic dilatation of small bowel or colon. Long segment thickening of the colonic wall involving predominantly the descending colon, concerning for colitis. Vascular/Lymphatic: Aortic atherosclerosis, without evidence of aneurysm or dissection. 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 No lymphadenopathy noted in the abdomen or pelvis. Reproductive: Prostate gland and seminal vesicles are unremarkable in appearance. Other: No significant volume of ascites.  No pneumoperitoneum. Musculoskeletal: There are no aggressive appearing lytic or blastic lesions noted in the visualized portions of the skeleton. IMPRESSION: 1. Large incompletely visualized mass centered in the left lower lobe highly concerning for primary bronchogenic neoplasm. Trace left pleural effusion is likely malignant, suggesting M1a disease. Further evaluation with contrast enhanced chest CT is recommended at this time to better evaluate the full extent of thoracic disease. 2. No definite evidence of extra thoracic metastatic disease in the abdomen or pelvis. 3. There are tiny subcentimeter low-attenuation lesions in  the liver which are too small to definitively characterize. Metastatic lesions are not entirely excluded, but are not strongly favored. 4. Long segment colonic  wall thickening of the descending colon, concerning for colitis. 5. Aortic atherosclerosis, in addition to at least 2 vessel coronary artery disease. Assessment for potential risk factor modification, dietary therapy or pharmacologic therapy may be warranted, if clinically indicated. 6. Additional incidental findings, as above. Electronically Signed   By: Vinnie Langton M.D.   On: 06/10/2016 15:47      Medications:   Impression:  Principal Problem:   Sepsis ([place]) Active Problems:   Carotid artery occlusion   Colitis   Mass of left lung   AKI (acute kidney injury) ([place])   Acute encephalopathy   Hypothermia     Plan: Monitor PT/INR daily for 4 days. Pulmonary consult request regarding left lower lobe mass. Gastroenterology records consult regarding possible colitis contributing to sepsis. Potassium chloride 10 mEq IV 4 runs monitor CBC and be met in a.m.  Consultants: Pulmonary and gastroenterology requested   Procedures   Antibiotics: Levaquin and Flagyl          Time spent: 30 minutes   LOS: 1 day   Malayja Freund M   06/11/2016, 6:35 AM

## 2016-06-11 NOTE — Care Management Note (Signed)
Case Management Note  Patient Details  Name: TRUXTON STUPKA MRN: [id number] Date of Birth: 04/09/1946  Subjective/Objective: Patient adm from home with sepsis. He lives alone, still drives to appointments, has no DME/HH PTA.                   Action/Plan:CM will follow for needs.   Expected Discharge Date:  06/13/16               Expected Discharge Plan:  Home/Self Care  In-House Referral:  NA  Discharge planning Services  CM Consult  Post Acute Care Choice:    Choice offered to:     DME Arranged:    DME Agency:     HH Arranged:    HH Agency:     Status of Service:  In process, will continue to follow  If discussed at Long Length of Stay Meetings, dates discussed:    Additional Comments:  Zyen Triggs, Chauncey Reading, RN 06/11/2016, 1:18 PM

## 2016-06-12 ENCOUNTER — Encounter (HOSPITAL_COMMUNITY): Payer: Self-pay | Admitting: Gastroenterology

## 2016-06-12 DIAGNOSIS — K529 Noninfective gastroenteritis and colitis, unspecified: Principal | ICD-10-CM

## 2016-06-12 DIAGNOSIS — A419 Sepsis, unspecified organism: Secondary | ICD-10-CM

## 2016-06-12 LAB — GLUCOSE, CAPILLARY
GLUCOSE-CAPILLARY: 86 mg/dL (ref 65–99)
GLUCOSE-CAPILLARY: 86 mg/dL (ref 65–99)
Glucose-Capillary: 181 mg/dL — ABNORMAL HIGH (ref 65–99)
Glucose-Capillary: 130 mg/dL — ABNORMAL HIGH (ref 65–99)

## 2016-06-12 LAB — CBC WITH DIFFERENTIAL/PLATELET
BASOS ABS: 0 10*3/uL (ref 0.0–0.1)
Basophils Relative: 0 %
EOS ABS: 0.1 10*3/uL (ref 0.0–0.7)
Eosinophils Relative: 1 %
HCT: 30.8 % — ABNORMAL LOW (ref 39.0–52.0)
Hemoglobin: 9.3 g/dL — ABNORMAL LOW (ref 13.0–17.0)
LYMPHS ABS: 0.6 10*3/uL — AB (ref 0.7–4.0)
Lymphocytes Relative: 7 %
MCH: 24.9 pg — AB (ref 26.0–34.0)
MCHC: 30.2 g/dL (ref 30.0–36.0)
MCV: 82.6 fL (ref 78.0–100.0)
MONO ABS: 0.8 10*3/uL (ref 0.1–1.0)
Monocytes Relative: 9 %
Neutro Abs: 7.7 10*3/uL (ref 1.7–7.7)
Neutrophils Relative %: 83 %
PLATELETS: 221 10*3/uL (ref 150–400)
RBC: 3.73 MIL/uL — AB (ref 4.22–5.81)
RDW: 17 % — ABNORMAL HIGH (ref 11.5–15.5)
WBC: 9.2 10*3/uL (ref 4.0–10.5)

## 2016-06-12 LAB — BASIC METABOLIC PANEL
Anion gap: 5 (ref 5–15)
BUN: 12 mg/dL (ref 6–20)
CALCIUM: 8.8 mg/dL — AB (ref 8.9–10.3)
CO2: 27 mmol/L (ref 22–32)
CREATININE: 0.95 mg/dL (ref 0.61–1.24)
Chloride: 106 mmol/L (ref 101–111)
GFR calc Af Amer: >60 mL/min (ref >60)
Glucose, Bld: 89 mg/dL (ref 65–99)
Potassium: 2.8 mmol/L — ABNORMAL LOW (ref 3.5–5.1)
SODIUM: 138 mmol/L (ref 135–145)

## 2016-06-12 LAB — PROTIME-INR
INR: 2.27
PROTHROMBIN TIME: 25.4 s — AB (ref 11.4–15.2)

## 2016-06-12 LAB — CBC
HCT: 28.9 % — ABNORMAL LOW (ref 39.0–52.0)
Hemoglobin: 9.2 g/dL — ABNORMAL LOW (ref 13.0–17.0)
MCH: 26.1 pg (ref 26.0–34.0)
MCHC: 31.8 g/dL (ref 30.0–36.0)
MCV: 81.9 fL (ref 78.0–100.0)
PLATELETS: 204 10*3/uL (ref 150–400)
RBC: 3.53 MIL/uL — AB (ref 4.22–5.81)
RDW: 17.2 % — ABNORMAL HIGH (ref 11.5–15.5)
WBC: 8.4 10*3/uL (ref 4.0–10.5)

## 2016-06-12 LAB — TSH: TSH: 1.686 u[IU]/mL (ref 0.350–4.500)

## 2016-06-12 LAB — VITAMIN B12: Vitamin B-12: 268 pg/mL (ref 180–914)

## 2016-06-12 LAB — HEMOGLOBIN A1C
Hgb A1c MFr Bld: 5.9 % — ABNORMAL HIGH (ref 4.8–5.6)
Mean Plasma Glucose: 123 mg/dL

## 2016-06-12 LAB — FOLATE: Folate: 5.4 ng/mL — ABNORMAL LOW (ref >5.9)

## 2016-06-12 LAB — SEDIMENTATION RATE: Sed Rate: 55 mm/hr — ABNORMAL HIGH (ref 0–16)

## 2016-06-12 MED ORDER — POTASSIUM CHLORIDE 10 MEQ/100ML IV SOLN
10.0000 meq | INTRAVENOUS | Status: AC
  Administered 2016-06-12 (×4): 10 meq via INTRAVENOUS
  Filled 2016-06-12 (×4): qty 100

## 2016-06-12 MED ORDER — IPRATROPIUM-ALBUTEROL 0.5-2.5 (3) MG/3ML IN SOLN: 3.0000 mL | RESPIRATORY_TRACT | Status: DC | PRN

## 2016-06-12 NOTE — Consult Note (Signed)
Referring Provider: Dr. Cindie Laroche  Primary Gastroenterologist:  Dr. Oneida Alar   Date of Admission: 06/10/16 Date of Consultation: 06/12/16  Reason for Consultation:  Colitis   HPI:  Frederick [name] is a 71 y.o. year old male who was found on the floor and poorly responsive by his brother, presenting to the ED with abdominal pain, N/V. Admitted with sepsis, incidental finding of left lung mass on CT abdomen and followed with CT chest noting large left lower mass concerning for primary bronchogenic carcinoma, acute encephalopathy, acute kidney injury, on Coumadin s/p CEA. Has had multiple recurrent falls, weight loss. GI now consulted due to colitis. Started empirically on Flagyl and Levaquin (allergic to PCN) on admission. Admitting WBC 25, lactic acid 4.9, blood cultures negative. Leukocytosis resolved. Hgb 9.3 but no overt signs of GI bleeding. Lactic acid normalized.   Poor historian. Does not remember much. States he has been having "black out" periods prior to admission. Does not believe he had been on any antibiotics. Thinks he remembers N/V prior to admission. Abdominal pain, N/V resolved at time of consultation. Tolerating heart healthy diet. No prior colonoscopy. Believes he has had diarrhea with warfarin historically but then quantifies as "only a small amount". No rectal bleeding. No prior colonoscopy. Feels a lot better this morning. States he knows about the lung mass and "you have to die of something".   A smear of stool overnight and one again this morning. No profuse diarrhea.   Past Medical History:  Diagnosis Date  . Carotid artery occlusion   . Hypercholesterolemia   . Hypertension   . Stroke ([place]) 2005  . Vertigo     Past Surgical History:  Procedure Laterality Date  . CAROTID ENDARTERECTOMY  2005   CVA  . CAROTID ENDARTERECTOMY  07/30/2008   Left CEA    Prior to Admission medications   Medication Sig Start Date End Date Taking? Authorizing Provider  amLODipine (NORVASC) 10  MG tablet Take 10 mg by mouth daily.     Yes Historical Provider, MD  enalapril (VASOTEC) 5 MG tablet Take 5 mg by mouth daily.     Yes Historical Provider, MD  ferrous sulfate 325 (65 FE) MG tablet Take 1 tablet by mouth daily. 05/18/16  Yes Historical Provider, MD  LORazepam (ATIVAN) 1 MG tablet Take 1 tablet (1 mg total) by mouth 3 (three) times daily as needed for anxiety (or restlessness). 06/06/16  Yes Nat Christen, MD  meclizine (ANTIVERT) 25 MG tablet Take 1 tablet (25 mg total) by mouth 3 (three) times daily as needed for dizziness. 06/06/16  Yes Nat Christen, MD  pravastatin (PRAVACHOL) 40 MG tablet Take 40 mg by mouth daily.     Yes Historical Provider, MD  buPROPion (ZYBAN) 150 MG 12 hr tablet One tab po qam for 3 days, then increase to one tab BID for 7 weeks Patient taking differently: Take 150 mg by mouth 2 (two) times daily. One tab po qam for 3 days, then increase to one tab BID for 7 weeks 05/21/11   Tammy Triplett, PA-C  COUMADIN 2.5 MG tablet Take 1 tablet by mouth daily. 03/15/16   Historical Provider, MD  warfarin (COUMADIN) 1 MG tablet Take 1 tablet by mouth daily. 05/14/16   Historical Provider, MD    Current Facility-Administered Medications  Medication Dose Route Frequency Provider Last Rate Last Dose  . acetaminophen (TYLENOL) tablet 650 mg  650 mg Oral Q6H PRN Vianne Bulls, MD       Or  .  acetaminophen (TYLENOL) suppository 650 mg  650 mg Rectal Q6H PRN Vianne Bulls, MD      . bisacodyl (DULCOLAX) suppository 10 mg  10 mg Rectal Daily PRN Vianne Bulls, MD      . feeding supplement (ENSURE ENLIVE) (ENSURE ENLIVE) liquid 237 mL  237 mL Oral BID BM Lucia Gaskins, MD      . Influenza vac split quadrivalent PF (FLUARIX) injection 0.5 mL  0.5 mL Intramuscular Tomorrow-1000 Timothy S Opyd, MD      . insulin aspart (novoLOG) injection 0-9 Units  0-9 Units Subcutaneous TID WC Rhetta Mura Schorr, NP      . ipratropium-albuterol (DUONEB) 0.5-2.5 (3) MG/3ML nebulizer solution 3  mL  3 mL Nebulization TID Lucia Gaskins, MD   3 mL at 06/12/16 6503  . levofloxacin (LEVAQUIN) IVPB 750 mg  750 mg Intravenous Q24H Lucia Gaskins, MD   750 mg at 06/12/16 0752  . metroNIDAZOLE (FLAGYL) IVPB 500 mg  500 mg Intravenous Q8H Vianne Bulls, MD   500 mg at 06/12/16 0704  . multivitamin with minerals tablet 1 tablet  1 tablet Oral Daily Lucia Gaskins, MD   1 tablet at 06/11/16 1839  . ondansetron (ZOFRAN) tablet 4 mg  4 mg Oral Q6H PRN Vianne Bulls, MD       Or  . ondansetron (ZOFRAN) injection 4 mg  4 mg Intravenous Q6H PRN Ilene Qua Opyd, MD      . pneumococcal 23 valent vaccine (PNU-IMMUNE) injection 0.5 mL  0.5 mL Intramuscular Tomorrow-1000 Timothy S Opyd, MD      . polyethylene glycol (MIRALAX / GLYCOLAX) packet 17 g  17 g Oral Daily PRN Vianne Bulls, MD        Allergies as of 06/10/2016 - Review Complete 06/10/2016  Allergen Reaction Noted  . Penicillins Nausea Only 03/21/2011    Family History  Problem Relation Age of Onset  . Diabetes Mother   . Heart disease Father   . Heart attack Father   . Colon cancer Neg Hx     Social History   Social History  . Marital status: Single    Spouse name: N/A  . Number of children: N/A  . Years of education: N/A   Occupational History  . Not on file.   Social History Main Topics  . Smoking status: Current Every Day Smoker    Packs/day: 1.00    Types: Cigarettes  . Smokeless tobacco: Never Used     Comment: "smoked all my life"   . Alcohol use No  . Drug use: No  . Sexual activity: Not Currently   Other Topics Concern  . Not on file   Social History Narrative  . No narrative on file    Review of Systems: Gen: see HPI  CV: Denies chest pain, heart palpitations, syncope, edema  Resp: +DOE, +SOB GI: see HPI  GU : Denies urinary burning, urinary frequency, urinary incontinence.  MS: Denies joint pain,swelling, cramping Derm: Denies rash, itching, dry skin Psych: +short-term memory issues  Heme:  see HPI   Physical Exam: Vital signs in last 24 hours: Temp:  [97.6 F (36.4 C)-98.7 F (37.1 C)] 98.4 F (36.9 C) (01/09 0800) Pulse Rate:  [73-83] 73 (01/09 0700) Resp:  [15-18] 16 (01/09 0700) BP: (89-108)/(65-71) 102/65 (01/09 0700) SpO2:  [98 %-100 %] 100 % (01/09 0810) Weight:  [117 lb 15.1 oz (53.5 kg)] 117 lb 15.1 oz (53.5 kg) (01/09 0500) Last BM Date: 06/10/16 General:  Alert, appears chronically ill, pleasant and cooperative Head:  Normocephalic and atraumatic. Eyes:  Sclera clear, no icterus.   Ears:  Normal auditory acuity. Nose:  No deformity, discharge,  or lesions. Mouth:  Poor dentition  Lungs: scattered rhonchi  Heart:  S1 S2 present without murmurs  Abdomen:  Soft, nontender and nondistended. No masses, hepatosplenomegaly or hernias noted. Normal bowel sounds, without guarding, and without rebound.   Rectal:  Deferred until time of colonoscopy.   Msk:  Symmetrical without gross deformities. Normal posture. Extremities:  Without edema, skin discolored consistent with PAD, warm to touch.  Neurologic:  Alert and oriented to person, place. Takes time to respond.  Psych:  Alert and cooperative.   Intake/Output from previous day: 01/08 0701 - 01/09 0700 In: 950 [IV Piggyback:950] Out: 950 [Urine:950] Intake/Output this shift: No intake/output data recorded.  Lab Results:  Recent Labs  06/10/16 1337 06/11/16 0418 06/12/16 0441  WBC 25.0* 13.5* 9.2  HGB 13.4 10.4* 9.3*  HCT 42.3 32.5* 30.8*  PLT 392 258 221   BMET  Recent Labs  06/10/16 1337 06/11/16 0418 06/12/16 0441  NA 142 143 138  K 3.6 2.9* 2.8*  CL 101 109 106  CO2 '24 27 27  '$ GLUCOSE 236* 90 89  BUN 27* 19 12  CREATININE 1.43* 0.94 0.95  CALCIUM 10.9* 8.7* 8.8*   LFT  Recent Labs  06/10/16 1337 06/11/16 0418  PROT 7.9 5.2*  ALBUMIN 3.2* 2.1*  AST 36 28  ALT 14* 9*  ALKPHOS 85 56  BILITOT 1.0 0.6   PT/INR  Recent Labs  06/11/16 0418 06/12/16 0441  LABPROT 34.6* 25.4*   INR 3.34 2.27    Studies/Results: Dg Chest 1 View  Result Date: 06/10/2016 CLINICAL DATA:  Acute mental status change. EXAM: CHEST 1 VIEW COMPARISON:  September 28, 2009 FINDINGS: There is a focal infiltrate in the left base which demonstrates a somewhat rounded lateral margin. No other interval changes or acute abnormalities. IMPRESSION: Somewhat rounded focal infiltrate in the left base medially. If the patient has signs of infection, recommend treatment with short-term follow-up. If the patient does not have signs for infection, recommend CT imaging. Electronically Signed   By: Dorise Bullion III M.D   On: 06/10/2016 15:11   Ct Head Wo Contrast  Result Date: 06/10/2016 CLINICAL DATA:  71 year old with four-day history of generalized weakness, vomiting, dizziness and anorexia with multiple falls. Patient lives alone but has not been able to care for himself over the past 4 days. EXAM: CT HEAD WITHOUT CONTRAST TECHNIQUE: Contiguous axial images were obtained from the base of the skull through the vertex without intravenous contrast. COMPARISON:  MRI brain 06/06/2016, 05/31/2008. CT head 06/06/2016, 08/27/2010, 05/29/2008. FINDINGS: Brain: Severe cortical atrophy, moderate deep atrophy and severe cerebellar atrophy, progressive since 2012. Encephalomalacia involving the left posterior frontal and superior temporal lobes at the site of the prior stroke, unchanged since 2012. Severe changes of small vessel disease of the white matter diffusely, progressive since 2012. No mass lesion. No midline shift. No acute hemorrhage or hematoma. No extra-axial fluid collections. No evidence of acute infarction. Vascular: Severe bilateral carotid siphon and mild left vertebral artery atherosclerosis. Skull: No skull fracture or other focal osseous abnormality involving the skull. Sinuses/Orbits: Visualized paranasal sinuses, bilateral mastoid air cells and bilateral middle ear cavities well-aerated. Visualized orbits  normal. Other: None. IMPRESSION: 1. No acute intracranial abnormality. 2. Severe generalized atrophy and severe chronic microvascular ischemic changes of the white matter diffusely, progressive since  2012. 3. 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 Electronically Signed   By: Evangeline Dakin M.D.   On: 06/10/2016 15:38   Ct Chest W Contrast  Result Date: 06/11/2016 CLINICAL DATA:  Abnormal CT abdomen pelvis, left lower lobe mass. Weakness and vomiting and dizziness. EXAM: CT CHEST WITH CONTRAST TECHNIQUE: Multidetector CT imaging of the chest was performed during intravenous contrast administration. CONTRAST:  62m ISOVUE-300 IOPAMIDOL (ISOVUE-300) INJECTION 61% COMPARISON:  CT abdomen pelvis 06/10/2016. FINDINGS: Cardiovascular: Atherosclerotic calcification of the arterial vasculature, including three-vessel involvement of the coronary arteries. Heart size normal. No pericardial effusion. Mediastinum/Nodes: No pathologically enlarged mediastinal, right hilar or axillary lymph nodes. Probable left hilar adenopathy, difficult to measure due to continuity with a large left lower lobe mass. Esophagus is grossly unremarkable. Lungs/Pleura: Heterogeneous left lower lobe mass measures 8.8 x 9.7 cm and crosses the left major fissure. It obstructs the left lower lobe bronchi with mild postobstructive pneumonitis in the peripheral left lower lobe. Associated trace left pleural effusion with pleural thickening and extrapleural lymph nodes measuring up to 5 mm (image 81). Trace right pleural effusion. Centrilobular emphysema. Debris seen dependently in the trachea. Upper Abdomen: Blush of hyper attenuation is seen in the dome of the liver (series 2, image 132), possibly due to a perfusion anomaly, as it is not seen on 06/10/2016. Subcentimeter low-attenuation lesions in the liver are too small to characterize. Visualized portions of the liver, gallbladder, adrenal glands, kidneys, spleen,  pancreas, stomach and bowel are otherwise grossly unremarkable. No upper abdominal adenopathy. Musculoskeletal: No worrisome lytic or sclerotic lesions. IMPRESSION: 1. Large left lower lobe mass crosses the left major fissure and is highly worrisome for primary bronchogenic carcinoma. Tiny left pleural effusion with suspected pleural thickening, worrisome but not definitive for malignant involvement. 2. Trace right pleural effusion. 3. Aortic atherosclerosis (ICD10-170.0). Three-vessel coronary artery calcification. 4.  Emphysema (ICD10-J43.9). Electronically Signed   By: MLorin PicketM.D.   On: 06/11/2016 10:19   Ct Abdomen Pelvis W Contrast  Result Date: 06/10/2016 CLINICAL DATA:  71year old male with history of weakness and vomiting. Dizziness. EXAM: CT ABDOMEN AND PELVIS WITH CONTRAST TECHNIQUE: Multidetector CT imaging of the abdomen and pelvis was performed using the standard protocol following bolus administration of intravenous contrast. CONTRAST:  756mISOVUE-300 IOPAMIDOL (ISOVUE-300) INJECTION 61% COMPARISON:  CT the abdomen pelvis 04/24/2004. FINDINGS: Lower chest: Large mass centered in the left lower lobe incompletely visualized but measuring at least 9.2 x 7.3 cm (image 1 of series 6) highly concerning for primary bronchogenic carcinoma. Trace left pleural effusion, likely malignant. Atherosclerotic calcifications in the left circumflex and right coronary arteries. Hepatobiliary: A few tiny subcentimeter low-attenuation lesions are noted in the liver, too small to definitively characterize. No large suspicious hepatic lesions are noted. No intra or extrahepatic biliary ductal dilatation. Gallbladder is unremarkable in appearance. Pancreas: No pancreatic mass. No pancreatic ductal dilatation. No pancreatic or peripancreatic fluid or inflammatory changes. Spleen: Calcified granulomas in the spleen. Adrenals/Urinary Tract: 13 mm low-attenuation lesion in the lower pole of the left kidney is  compatible with a simple cyst. Right kidney and bilateral adrenal glands are normal in appearance. No hydroureteronephrosis. Urinary bladder is normal in appearance. Stomach/Bowel: The appearance of the stomach is normal. There is no pathologic dilatation of small bowel or colon. Long segment thickening of the colonic wall involving predominantly the descending colon, concerning for colitis. Vascular/Lymphatic: Aortic atherosclerosis, without evidence of aneurysm or dissection. 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 No lymphadenopathy noted in the abdomen or pelvis. Reproductive: Prostate gland and seminal vesicles are unremarkable in appearance. Other: No significant volume of ascites.  No pneumoperitoneum. Musculoskeletal: There are no aggressive appearing lytic or blastic lesions noted in the visualized portions of the skeleton. IMPRESSION: 1. Large incompletely visualized mass centered in the left lower lobe highly concerning for primary bronchogenic neoplasm. Trace left pleural effusion is likely malignant, suggesting M1a disease. Further evaluation with contrast enhanced chest CT is recommended at this time to better evaluate the full extent of thoracic disease. 2. No definite evidence of extra thoracic metastatic disease in the abdomen or pelvis. 3. There are tiny subcentimeter low-attenuation lesions in the liver which are too small to definitively characterize. Metastatic lesions are not entirely excluded, but are not strongly favored. 4. Long segment colonic wall thickening of the descending colon, concerning for colitis. 5. Aortic atherosclerosis, in addition to at least 2 vessel coronary artery disease. Assessment for potential risk factor modification, dietary therapy or pharmacologic therapy may be warranted, if clinically indicated. 6. Additional  incidental findings, as above. Electronically Signed   By: Vinnie Langton M.D.   On: 06/10/2016 15:47    Impression: 71 year old male admitted with sepsis, mental status changes, findings of long-segment colonic wall thickening of descending colon. Incidental finding of large lung mass, which is under evaluation by Dr. Luan Pulling. Poor historian and unable to give complete details but clinically has improved from a GI standpoint. Tolerating diet, no diarrhea, and leukocytosis resolved. Question infectious vs ischemic. He has notable atherosclerotic disease on CT. No prior colonoscopy, which could be pursued as outpatient. Continue empiric antibiotics, transitioning to oral in next 24 hours.   Anemia: no overt GI bleeding. Multifactorial.   Plan: Stool studies if diarrhea recurs Continue empiric antibiotics, transition to oral 06/13/16 Outpatient colonoscopy Further evaluation per pulmonology of lung mass   Annitta Needs, ANP-BC [facility]      LOS: 2 days    06/12/2016, 9:12 AM

## 2016-06-12 NOTE — Plan of Care (Signed)
Problem: Tissue Perfusion: Goal: Risk factors for ineffective tissue perfusion will decrease Outcome: Progressing Pt taking coumidan

## 2016-06-12 NOTE — Progress Notes (Signed)
Appreciate pulmonary and GI expertise CT scan of chest pending potassium still low will repeat 4 units of KCl and added oral KCl presuming some GI loss? Patient alert and oriented 3 he is rather hesitant to engage in conversation this is his baseline status Frederick [name] VZC:[id number] DOB: 12-16-1945 DOA: 06/10/2016 PCP: Manus Rudd, MD   Physical Exam: Blood pressure 102/65, pulse 73, temperature 97.8 F (36.6 C), temperature source Oral, resp. rate 16, height _0  (1.778 m), weight 53.5 kg (117 lb 15.1 oz), SpO2 100 %. Lungs show diminished breath sounds in bases prolonged expiratory phase no rales no wheezes no rhonchi appreciable heart regular rhythm no S3 or S4 no heaves thrills rubs abdomen soft nontender bowel sounds normoactive no guarding or rebound   Investigations:  Recent Results (from the past 240 hour(s))  Blood culture (routine x 2)     Status: None (Preliminary result)   Collection Time: 06/10/16  2:16 PM  Result Value Ref Range Status   Specimen Description BLOOD LEFT ANTECUBITAL  Final   Special Requests BOTTLES DRAWN AEROBIC AND ANAEROBIC 8CC  Final   Culture NO GROWTH 2 DAYS  Final   Report Status PENDING  Incomplete  Blood culture (routine x 2)     Status: None (Preliminary result)   Collection Time: 06/10/16  2:24 PM  Result Value Ref Range Status   Specimen Description BLOOD  Final   Special Requests NONE  Final   Culture NO GROWTH 2 DAYS  Final   Report Status PENDING  Incomplete  MRSA PCR Screening     Status: None   Collection Time: 06/10/16  5:17 PM  Result Value Ref Range Status   MRSA by PCR NEGATIVE NEGATIVE Final    Comment:        The GeneXpert MRSA Assay (FDA approved for NASAL specimens only), is one component of a comprehensive MRSA colonization surveillance program. It is not intended to diagnose MRSA infection nor to guide or monitor treatment for MRSA infections.      Basic Metabolic Panel:  Recent Labs  06/11/16 0418  06/12/16 0441  NA 143 138  K 2.9* 2.8*  CL 109 106  CO2 27 27  GLUCOSE 90 89  BUN 19 12  CREATININE 0.94 0.95  CALCIUM 8.7* 8.8*   Liver Function Tests:  Recent Labs  06/10/16 1337 06/11/16 0418  AST 36 28  ALT 14* 9*  ALKPHOS 85 56  BILITOT 1.0 0.6  PROT 7.9 5.2*  ALBUMIN 3.2* 2.1*     CBC:  Recent Labs  06/11/16 0418 06/12/16 0441  WBC 13.5* 9.2  NEUTROABS 11.5* 7.7  HGB 10.4* 9.3*  HCT 32.5* 30.8*  MCV 83.8 82.6  PLT 258 221    Dg Chest 1 View  Result Date: 06/10/2016 CLINICAL DATA:  Acute mental status change. EXAM: CHEST 1 VIEW COMPARISON:  September 28, 2009 FINDINGS: There is a focal infiltrate in the left base which demonstrates a somewhat rounded lateral margin. No other interval changes or acute abnormalities. IMPRESSION: Somewhat rounded focal infiltrate in the left base medially. If the patient has signs of infection, recommend treatment with short-term follow-up. If the patient does not have signs for infection, recommend CT imaging. Electronically Signed   By: Dorise Bullion III M.D   On: 06/10/2016 15:11   Ct Head Wo Contrast  Result Date: 06/10/2016 CLINICAL DATA:  71 year old with four-day history of generalized weakness, vomiting, dizziness and anorexia with multiple falls. Patient lives alone but has not  been able to care for himself over the past 4 days. EXAM: CT HEAD WITHOUT CONTRAST TECHNIQUE: Contiguous axial images were obtained from the base of the skull through the vertex without intravenous contrast. COMPARISON:  MRI brain 06/06/2016, 05/31/2008. CT head 06/06/2016, 08/27/2010, 05/29/2008. FINDINGS: Brain: Severe cortical atrophy, moderate deep atrophy and severe cerebellar atrophy, progressive since 2012. Encephalomalacia involving the left posterior frontal and superior temporal lobes at the site of the prior stroke, unchanged since 2012. Severe changes of small vessel disease of the white matter diffusely, progressive since 2012. No mass lesion.  No midline shift. No acute hemorrhage or hematoma. No extra-axial fluid collections. No evidence of acute infarction. Vascular: Severe bilateral carotid siphon and mild left vertebral artery atherosclerosis. Skull: No skull fracture or other focal osseous abnormality involving the skull. Sinuses/Orbits: Visualized paranasal sinuses, bilateral mastoid air cells and bilateral middle ear cavities well-aerated. Visualized orbits normal. Other: None. IMPRESSION: 1. No acute intracranial abnormality. 2. Severe generalized atrophy and severe chronic microvascular ischemic changes of the white matter diffusely, progressive since 2012. 3. 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 Electronically Signed   By: Evangeline Dakin M.D.   On: 06/10/2016 15:38   Ct Chest W Contrast  Result Date: 06/11/2016 CLINICAL DATA:  Abnormal CT abdomen pelvis, left lower lobe mass. Weakness and vomiting and dizziness. EXAM: CT CHEST WITH CONTRAST TECHNIQUE: Multidetector CT imaging of the chest was performed during intravenous contrast administration. CONTRAST:  67m ISOVUE-300 IOPAMIDOL (ISOVUE-300) INJECTION 61% COMPARISON:  CT abdomen pelvis 06/10/2016. FINDINGS: Cardiovascular: Atherosclerotic calcification of the arterial vasculature, including three-vessel involvement of the coronary arteries. Heart size normal. No pericardial effusion. Mediastinum/Nodes: No pathologically enlarged mediastinal, right hilar or axillary lymph nodes. Probable left hilar adenopathy, difficult to measure due to continuity with a large left lower lobe mass. Esophagus is grossly unremarkable. Lungs/Pleura: Heterogeneous left lower lobe mass measures 8.8 x 9.7 cm and crosses the left major fissure. It obstructs the left lower lobe bronchi with mild postobstructive pneumonitis in the peripheral left lower lobe. Associated trace left pleural effusion with pleural thickening and extrapleural lymph nodes measuring up to 5 mm (image 81).  Trace right pleural effusion. Centrilobular emphysema. Debris seen dependently in the trachea. Upper Abdomen: Blush of hyper attenuation is seen in the dome of the liver (series 2, image 132), possibly due to a perfusion anomaly, as it is not seen on 06/10/2016. Subcentimeter low-attenuation lesions in the liver are too small to characterize. Visualized portions of the liver, gallbladder, adrenal glands, kidneys, spleen, pancreas, stomach and bowel are otherwise grossly unremarkable. No upper abdominal adenopathy. Musculoskeletal: No worrisome lytic or sclerotic lesions. IMPRESSION: 1. Large left lower lobe mass crosses the left major fissure and is highly worrisome for primary bronchogenic carcinoma. Tiny left pleural effusion with suspected pleural thickening, worrisome but not definitive for malignant involvement. 2. Trace right pleural effusion. 3. Aortic atherosclerosis (ICD10-170.0). Three-vessel coronary artery calcification. 4.  Emphysema (ICD10-J43.9). Electronically Signed   By: MLorin PicketM.D.   On: 06/11/2016 10:19   Ct Abdomen Pelvis W Contrast  Result Date: 06/10/2016 CLINICAL DATA:  71year old male with history of weakness and vomiting. Dizziness. EXAM: CT ABDOMEN AND PELVIS WITH CONTRAST TECHNIQUE: Multidetector CT imaging of the abdomen and pelvis was performed using the standard protocol following bolus administration of intravenous contrast. CONTRAST:  773mISOVUE-300 IOPAMIDOL (ISOVUE-300) INJECTION 61% COMPARISON:  CT the abdomen pelvis 04/24/2004. FINDINGS: Lower chest: Large mass centered in the left lower lobe incompletely visualized but measuring  at least 9.2 x 7.3 cm (image 1 of series 6) highly concerning for primary bronchogenic carcinoma. Trace left pleural effusion, likely malignant. Atherosclerotic calcifications in the left circumflex and right coronary arteries. Hepatobiliary: A few tiny subcentimeter low-attenuation lesions are noted in the liver, too small to definitively  characterize. No large suspicious hepatic lesions are noted. No intra or extrahepatic biliary ductal dilatation. Gallbladder is unremarkable in appearance. Pancreas: No pancreatic mass. No pancreatic ductal dilatation. No pancreatic or peripancreatic fluid or inflammatory changes. Spleen: Calcified granulomas in the spleen. Adrenals/Urinary Tract: 13 mm low-attenuation lesion in the lower pole of the left kidney is compatible with a simple cyst. Right kidney and bilateral adrenal glands are normal in appearance. No hydroureteronephrosis. Urinary bladder is normal in appearance. Stomach/Bowel: The appearance of the stomach is normal. There is no pathologic dilatation of small bowel or colon. Long segment thickening of the colonic wall involving predominantly the descending colon, concerning for colitis. Vascular/Lymphatic: Aortic atherosclerosis, without evidence of aneurysm or dissection. 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 No lymphadenopathy noted in the abdomen or pelvis. Reproductive: Prostate gland and seminal vesicles are unremarkable in appearance. Other: No significant volume of ascites.  No pneumoperitoneum. Musculoskeletal: There are no aggressive appearing lytic or blastic lesions noted in the visualized portions of the skeleton. IMPRESSION: 1. Large incompletely visualized mass centered in the left lower lobe highly concerning for primary bronchogenic neoplasm. Trace left pleural effusion is likely malignant, suggesting M1a disease. Further evaluation with contrast enhanced chest CT is recommended at this time to better evaluate the full extent of thoracic disease. 2. No definite evidence of extra thoracic metastatic disease in the abdomen or pelvis. 3. There are tiny subcentimeter low-attenuation lesions in the liver which are too small to definitively  characterize. Metastatic lesions are not entirely excluded, but are not strongly favored. 4. Long segment colonic wall thickening of the descending colon, concerning for colitis. 5. Aortic atherosclerosis, in addition to at least 2 vessel coronary artery disease. Assessment for potential risk factor modification, dietary therapy or pharmacologic therapy may be warranted, if clinically indicated. 6. Additional incidental findings, as above. Electronically Signed   By: Vinnie Langton M.D.   On: 06/10/2016 15:47      Medications:  Impression:  Principal Problem:   Sepsis ([place]) Active Problems:   Carotid artery occlusion   Colitis   Mass of left lung   AKI (acute kidney injury) ([place])   Acute encephalopathy   Hypothermia     Plan: KCl 10 mEq IV times for add KCl 20 mEq by mouth daily check be met in a.m. await results of chest CT and continue dual antibiotics for presumed colitis will perform a dementia workup   Consultants: Pulmonary and gastroenterology   Procedures   Antibiotics: Levaquin and Flagyl         Time spent: 30 minutes   LOS: 2 days   Manuela Halbur M   06/12/2016, 12:21 PM

## 2016-06-12 NOTE — Plan of Care (Signed)
Problem: Pain Managment: Goal: General experience of comfort will improve Pa able to use pain scale to verbalize pain level. Both  pharmacological and non pharmacological  methods used to maintain comfort.

## 2016-06-12 NOTE — Progress Notes (Signed)
Spoke to Dr. Cindie Laroche about patient potassium level of 2.8. He ordered 4 runs of potassium.

## 2016-06-12 NOTE — Progress Notes (Signed)
Subjective: He is more alert this morning. I was able to discuss his CT results with him to some extent. I don't know if he's back at his baseline mental status and he still seems somewhat distracted and perhaps a little confused. He says he feels better. No other new complaints. He says his breathing is okay. He is very weak. No hemoptysis. No abdominal pain nausea or vomiting now.  Objective: Vital signs in last 24 hours: Temp:  [97.4 F (36.3 C)-98.7 F (37.1 C)] 98.5 F (36.9 C) (01/09 0400) Pulse Rate:  [78-83] 78 (01/08 2200) Resp:  [14-18] 15 (01/08 2200) BP: (89-108)/(67-71) 89/67 (01/08 2200) SpO2:  [98 %-100 %] 100 % (01/08 2200) Weight:  [53.5 kg (117 lb 15.1 oz)] 53.5 kg (117 lb 15.1 oz) (01/09 0500) Weight change: -5.468 kg (-12 lb 0.9 oz) Last BM Date: 06/10/16  Intake/Output from previous day: 01/08 0701 - 01/09 0700 In: 950 [IV Piggyback:950] Out: 950 [Urine:950]  PHYSICAL EXAM General appearance: alert, cooperative and mild distress Resp: rhonchi bilaterally Cardio: regular rate and rhythm, S1, S2 normal, no murmur, click, rub or gallop GI: soft, non-tender; bowel sounds normal; no masses,  no organomegaly Extremities: extremities normal, atraumatic, no cyanosis or edema Skin warm and dry. Mucous membranes still slightly dry. Pupils reactive  Lab Results:  Results for orders placed or performed during the hospital encounter of 06/10/16 (from the past 48 hour(s))  CBC WITH DIFFERENTIAL     Status: Abnormal   Collection Time: 06/10/16  1:37 PM  Result Value Ref Range   WBC 25.0 (H) 4.0 - 10.5 K/uL   RBC 5.05 4.22 - 5.81 MIL/uL   Hemoglobin 13.4 13.0 - 17.0 g/dL   HCT 42.3 39.0 - 52.0 %   MCV 83.8 78.0 - 100.0 fL   MCH 26.5 26.0 - 34.0 pg   MCHC 31.7 30.0 - 36.0 g/dL   RDW 16.4 (H) 11.5 - 15.5 %   Platelets 392 150 - 400 K/uL   Neutrophils Relative % 93 %   Neutro Abs 23.2 (H) 1.7 - 7.7 K/uL   Lymphocytes Relative 4 %   Lymphs Abs 0.9 0.7 - 4.0 K/uL    Monocytes Relative 3 %   Monocytes Absolute 0.8 0.1 - 1.0 K/uL   Eosinophils Relative 0 %   Eosinophils Absolute 0.0 0.0 - 0.7 K/uL   Basophils Relative 0 %   Basophils Absolute 0.0 0.0 - 0.1 K/uL  Comprehensive metabolic panel     Status: Abnormal   Collection Time: 06/10/16  1:37 PM  Result Value Ref Range   Sodium 142 135 - 145 mmol/L   Potassium 3.6 3.5 - 5.1 mmol/L   Chloride 101 101 - 111 mmol/L   CO2 24 22 - 32 mmol/L   Glucose, Bld 236 (H) 65 - 99 mg/dL   BUN 27 (H) 6 - 20 mg/dL   Creatinine, Ser 1.43 (H) 0.61 - 1.24 mg/dL   Calcium 10.9 (H) 8.9 - 10.3 mg/dL   Total Protein 7.9 6.5 - 8.1 g/dL   Albumin 3.2 (L) 3.5 - 5.0 g/dL   AST 36 15 - 41 U/L   ALT 14 (L) 17 - 63 U/L   Alkaline Phosphatase 85 38 - 126 U/L   Total Bilirubin 1.0 0.3 - 1.2 mg/dL   GFR calc non Af Amer 48 (L) >60 mL/min   GFR calc Af Amer 55 (L) >60 mL/min    Comment: (NOTE) The eGFR has been calculated using the CKD EPI equation.  This calculation has not been validated in all clinical situations. eGFR's persistently <60 mL/min signify possible Chronic Kidney Disease.    Anion gap 17 (H) 5 - 15  Protime-INR     Status: Abnormal   Collection Time: 06/10/16  1:37 PM  Result Value Ref Range   Prothrombin Time 30.1 (H) 11.4 - 15.2 seconds   INR 2.80   Procalcitonin     Status: None   Collection Time: 06/10/16  1:37 PM  Result Value Ref Range   Procalcitonin 0.15 ng/mL    Comment:        Interpretation: PCT (Procalcitonin) <= 0.5 ng/mL: Systemic infection (sepsis) is not likely. Local bacterial infection is possible. (NOTE)         ICU PCT Algorithm               Non ICU PCT Algorithm    ----------------------------     ------------------------------         PCT < 0.25 ng/mL                 PCT < 0.1 ng/mL     Stopping of antibiotics            Stopping of antibiotics       strongly encouraged.               strongly encouraged.    ----------------------------     ------------------------------        PCT level decrease by               PCT < 0.25 ng/mL       >= 80% from peak PCT       OR PCT 0.25 - 0.5 ng/mL          Stopping of antibiotics                                             encouraged.     Stopping of antibiotics           encouraged.    ----------------------------     ------------------------------       PCT level decrease by              PCT >= 0.25 ng/mL       < 80% from peak PCT        AND PCT >= 0.5 ng/mL            Continuin g antibiotics                                              encouraged.       Continuing antibiotics            encouraged.    ----------------------------     ------------------------------     PCT level increase compared          PCT > 0.5 ng/mL         with peak PCT AND          PCT >= 0.5 ng/mL             Escalation of antibiotics  strongly encouraged.      Escalation of antibiotics        strongly encouraged.   Hemoglobin A1c     Status: Abnormal   Collection Time: 06/10/16  1:37 PM  Result Value Ref Range   Hgb A1c MFr Bld 5.9 (H) 4.8 - 5.6 %    Comment: (NOTE)         Pre-diabetes: 5.7 - 6.4         Diabetes: >6.4         Glycemic control for adults with diabetes: <7.0    Mean Plasma Glucose 123 mg/dL    Comment: (NOTE) Performed At: [facility] [place], [place] [id number] Lindon Romp MD UX:[id number]   Ammonia     Status: None   Collection Time: 06/10/16  1:38 PM  Result Value Ref Range   Ammonia 11 9 - 35 umol/L  I-Stat CG4 Lactic Acid, ED  (not at [facility])     Status: Abnormal   Collection Time: 06/10/16  1:47 PM  Result Value Ref Range   Lactic Acid, Venous 4.90 (HH) 0.5 - 1.9 mmol/L  CBG monitoring, ED     Status: Abnormal   Collection Time: 06/10/16  1:48 PM  Result Value Ref Range   Glucose-Capillary 208 (H) 65 - 99 mg/dL  Urine rapid drug screen (hosp performed)not at [facility]     Status: Abnormal   Collection Time: 06/10/16  1:52 PM  Result  Value Ref Range   Opiates NONE DETECTED NONE DETECTED   Cocaine NONE DETECTED NONE DETECTED   Benzodiazepines POSITIVE (A) NONE DETECTED   Amphetamines NONE DETECTED NONE DETECTED   Tetrahydrocannabinol NONE DETECTED NONE DETECTED   Barbiturates NONE DETECTED NONE DETECTED    Comment:        DRUG SCREEN FOR MEDICAL PURPOSES ONLY.  IF CONFIRMATION IS NEEDED FOR ANY PURPOSE, NOTIFY LAB WITHIN 5 DAYS.        LOWEST DETECTABLE LIMITS FOR URINE DRUG SCREEN Drug Class       Cutoff (ng/mL) Amphetamine      1000 Barbiturate      200 Benzodiazepine   166 Tricyclics       060 Opiates          300 Cocaine          300 THC              50   Urinalysis, Routine w reflex microscopic     Status: Abnormal   Collection Time: 06/10/16  1:52 PM  Result Value Ref Range   Color, Urine YELLOW YELLOW   APPearance CLEAR CLEAR   Specific Gravity, Urine 1.020 1.005 - 1.030   pH 5.0 5.0 - 8.0   Glucose, UA NEGATIVE NEGATIVE mg/dL   Hgb urine dipstick SMALL (A) NEGATIVE   Bilirubin Urine NEGATIVE NEGATIVE   Ketones, ur 20 (A) NEGATIVE mg/dL   Protein, ur 30 (A) NEGATIVE mg/dL   Nitrite NEGATIVE NEGATIVE   Leukocytes, UA NEGATIVE NEGATIVE   RBC / HPF 0-5 0 - 5 RBC/hpf   WBC, UA 0-5 0 - 5 WBC/hpf   Bacteria, UA RARE (A) NONE SEEN   Mucous PRESENT    Hyaline Casts, UA PRESENT   Blood culture (routine x 2)     Status: None (Preliminary result)   Collection Time: 06/10/16  2:16 PM  Result Value Ref Range   Specimen Description BLOOD LEFT ANTECUBITAL    Special Requests BOTTLES DRAWN AEROBIC AND ANAEROBIC 8CC  Culture NO GROWTH < 24 HOURS    Report Status PENDING   Lactic acid, plasma     Status: Abnormal   Collection Time: 06/10/16  2:16 PM  Result Value Ref Range   Lactic Acid, Venous 3.3 (HH) 0.5 - 1.9 mmol/L    Comment: CRITICAL RESULT CALLED TO, READ BACK BY AND VERIFIED WITH: FREEMAN,V AT 1624 ON 06/10/2016 BY MOSLEY,J   Blood culture (routine x 2)     Status: None (Preliminary result)    Collection Time: 06/10/16  2:24 PM  Result Value Ref Range   Specimen Description BLOOD    Special Requests NONE    Culture NO GROWTH < 24 HOURS    Report Status PENDING   MRSA PCR Screening     Status: None   Collection Time: 06/10/16  5:17 PM  Result Value Ref Range   MRSA by PCR NEGATIVE NEGATIVE    Comment:        The GeneXpert MRSA Assay (FDA approved for NASAL specimens only), is one component of a comprehensive MRSA colonization surveillance program. It is not intended to diagnose MRSA infection nor to guide or monitor treatment for MRSA infections.   Glucose, capillary     Status: None   Collection Time: 06/10/16  6:51 PM  Result Value Ref Range   Glucose-Capillary 83 65 - 99 mg/dL  Lactic acid, plasma     Status: None   Collection Time: 06/10/16  8:29 PM  Result Value Ref Range   Lactic Acid, Venous 1.5 0.5 - 1.9 mmol/L  Glucose, capillary     Status: None   Collection Time: 06/10/16  8:37 PM  Result Value Ref Range   Glucose-Capillary 84 65 - 99 mg/dL   Comment 1 Notify RN   CBC with Differential     Status: Abnormal   Collection Time: 06/11/16  4:18 AM  Result Value Ref Range   WBC 13.5 (H) 4.0 - 10.5 K/uL   RBC 3.88 (L) 4.22 - 5.81 MIL/uL   Hemoglobin 10.4 (L) 13.0 - 17.0 g/dL    Comment: DELTA CHECK NOTED   HCT 32.5 (L) 39.0 - 52.0 %   MCV 83.8 78.0 - 100.0 fL   MCH 26.8 26.0 - 34.0 pg   MCHC 32.0 30.0 - 36.0 g/dL   RDW 16.8 (H) 11.5 - 15.5 %   Platelets 258 150 - 400 K/uL   Neutrophils Relative % 85 %   Neutro Abs 11.5 (H) 1.7 - 7.7 K/uL   Lymphocytes Relative 6 %   Lymphs Abs 0.8 0.7 - 4.0 K/uL   Monocytes Relative 8 %   Monocytes Absolute 1.1 (H) 0.1 - 1.0 K/uL   Eosinophils Relative 1 %   Eosinophils Absolute 0.1 0.0 - 0.7 K/uL   Basophils Relative 0 %   Basophils Absolute 0.0 0.0 - 0.1 K/uL  Comprehensive metabolic panel     Status: Abnormal   Collection Time: 06/11/16  4:18 AM  Result Value Ref Range   Sodium 143 135 - 145 mmol/L    Potassium 2.9 (L) 3.5 - 5.1 mmol/L    Comment: DELTA CHECK NOTED   Chloride 109 101 - 111 mmol/L   CO2 27 22 - 32 mmol/L   Glucose, Bld 90 65 - 99 mg/dL   BUN 19 6 - 20 mg/dL   Creatinine, Ser 0.94 0.61 - 1.24 mg/dL   Calcium 8.7 (L) 8.9 - 10.3 mg/dL   Total Protein 5.2 (L) 6.5 - 8.1 g/dL   Albumin  2.1 (L) 3.5 - 5.0 g/dL   AST 28 15 - 41 U/L   ALT 9 (L) 17 - 63 U/L   Alkaline Phosphatase 56 38 - 126 U/L   Total Bilirubin 0.6 0.3 - 1.2 mg/dL   GFR calc non Af Amer >60 >60 mL/min   GFR calc Af Amer >60 >60 mL/min    Comment: (NOTE) The eGFR has been calculated using the CKD EPI equation. This calculation has not been validated in all clinical situations. eGFR's persistently <60 mL/min signify possible Chronic Kidney Disease.    Anion gap 7 5 - 15  Protime-INR     Status: Abnormal   Collection Time: 06/11/16  4:18 AM  Result Value Ref Range   Prothrombin Time 34.6 (H) 11.4 - 15.2 seconds   INR 3.34   Glucose, capillary     Status: None   Collection Time: 06/11/16  7:17 AM  Result Value Ref Range   Glucose-Capillary 80 65 - 99 mg/dL   Comment 1 Notify RN    Comment 2 Document in Chart   Glucose, capillary     Status: Abnormal   Collection Time: 06/11/16 11:05 AM  Result Value Ref Range   Glucose-Capillary 115 (H) 65 - 99 mg/dL   Comment 1 Notify RN    Comment 2 Document in Chart   Glucose, capillary     Status: None   Collection Time: 06/11/16  4:58 PM  Result Value Ref Range   Glucose-Capillary 84 65 - 99 mg/dL  Glucose, capillary     Status: None   Collection Time: 06/11/16  9:32 PM  Result Value Ref Range   Glucose-Capillary 93 65 - 99 mg/dL  Basic metabolic panel     Status: Abnormal   Collection Time: 06/12/16  4:41 AM  Result Value Ref Range   Sodium 138 135 - 145 mmol/L   Potassium 2.8 (L) 3.5 - 5.1 mmol/L   Chloride 106 101 - 111 mmol/L   CO2 27 22 - 32 mmol/L   Glucose, Bld 89 65 - 99 mg/dL   BUN 12 6 - 20 mg/dL   Creatinine, Ser 0.95 0.61 - 1.24 mg/dL    Calcium 8.8 (L) 8.9 - 10.3 mg/dL   GFR calc non Af Amer >60 >60 mL/min   GFR calc Af Amer >60 >60 mL/min    Comment: (NOTE) The eGFR has been calculated using the CKD EPI equation. This calculation has not been validated in all clinical situations. eGFR's persistently <60 mL/min signify possible Chronic Kidney Disease.    Anion gap 5 5 - 15  CBC with Differential/Platelet     Status: Abnormal   Collection Time: 06/12/16  4:41 AM  Result Value Ref Range   WBC 9.2 4.0 - 10.5 K/uL   RBC 3.73 (L) 4.22 - 5.81 MIL/uL   Hemoglobin 9.3 (L) 13.0 - 17.0 g/dL   HCT 30.8 (L) 39.0 - 52.0 %   MCV 82.6 78.0 - 100.0 fL   MCH 24.9 (L) 26.0 - 34.0 pg   MCHC 30.2 30.0 - 36.0 g/dL   RDW 17.0 (H) 11.5 - 15.5 %   Platelets 221 150 - 400 K/uL   Neutrophils Relative % 83 %   Lymphocytes Relative 7 %   Monocytes Relative 9 %   Eosinophils Relative 1 %   Basophils Relative 0 %   Neutro Abs 7.7 1.7 - 7.7 K/uL   Lymphs Abs 0.6 (L) 0.7 - 4.0 K/uL   Monocytes Absolute 0.8 0.1 -  1.0 K/uL   Eosinophils Absolute 0.1 0.0 - 0.7 K/uL   Basophils Absolute 0.0 0.0 - 0.1 K/uL  Protime-INR     Status: Abnormal   Collection Time: 06/12/16  4:41 AM  Result Value Ref Range   Prothrombin Time 25.4 (H) 11.4 - 15.2 seconds   INR 2.27     ABGS No results for input(s): PHART, PO2ART, TCO2, HCO3 in the last 72 hours.  Invalid input(s): PCO2 CULTURES Recent Results (from the past 240 hour(s))  Blood culture (routine x 2)     Status: None (Preliminary result)   Collection Time: 06/10/16  2:16 PM  Result Value Ref Range Status   Specimen Description BLOOD LEFT ANTECUBITAL  Final   Special Requests BOTTLES DRAWN AEROBIC AND ANAEROBIC 8CC  Final   Culture NO GROWTH < 24 HOURS  Final   Report Status PENDING  Incomplete  Blood culture (routine x 2)     Status: None (Preliminary result)   Collection Time: 06/10/16  2:24 PM  Result Value Ref Range Status   Specimen Description BLOOD  Final   Special Requests NONE   Final   Culture NO GROWTH < 24 HOURS  Final   Report Status PENDING  Incomplete  MRSA PCR Screening     Status: None   Collection Time: 06/10/16  5:17 PM  Result Value Ref Range Status   MRSA by PCR NEGATIVE NEGATIVE Final    Comment:        The GeneXpert MRSA Assay (FDA approved for NASAL specimens only), is one component of a comprehensive MRSA colonization surveillance program. It is not intended to diagnose MRSA infection nor to guide or monitor treatment for MRSA infections.    Studies/Results: Dg Chest 1 View  Result Date: 06/10/2016 CLINICAL DATA:  Acute mental status change. EXAM: CHEST 1 VIEW COMPARISON:  September 28, 2009 FINDINGS: There is a focal infiltrate in the left base which demonstrates a somewhat rounded lateral margin. No other interval changes or acute abnormalities. IMPRESSION: Somewhat rounded focal infiltrate in the left base medially. If the patient has signs of infection, recommend treatment with short-term follow-up. If the patient does not have signs for infection, recommend CT imaging. Electronically Signed   By: Dorise Bullion III M.D   On: 06/10/2016 15:11   Ct Head Wo Contrast  Result Date: 06/10/2016 CLINICAL DATA:  71 year old with four-day history of generalized weakness, vomiting, dizziness and anorexia with multiple falls. Patient lives alone but has not been able to care for himself over the past 4 days. EXAM: CT HEAD WITHOUT CONTRAST TECHNIQUE: Contiguous axial images were obtained from the base of the skull through the vertex without intravenous contrast. COMPARISON:  MRI brain 06/06/2016, 05/31/2008. CT head 06/06/2016, 08/27/2010, 05/29/2008. FINDINGS: Brain: Severe cortical atrophy, moderate deep atrophy and severe cerebellar atrophy, progressive since 2012. Encephalomalacia involving the left posterior frontal and superior temporal lobes at the site of the prior stroke, unchanged since 2012. Severe changes of small vessel disease of the white matter  diffusely, progressive since 2012. No mass lesion. No midline shift. No acute hemorrhage or hematoma. No extra-axial fluid collections. No evidence of acute infarction. Vascular: Severe bilateral carotid siphon and mild left vertebral artery atherosclerosis. Skull: No skull fracture or other focal osseous abnormality involving the skull. Sinuses/Orbits: Visualized paranasal sinuses, bilateral mastoid air cells and bilateral middle ear cavities well-aerated. Visualized orbits normal. Other: None. IMPRESSION: 1. No acute intracranial abnormality. 2. Severe generalized atrophy and severe chronic microvascular ischemic changes of the  white matter diffusely, progressive since 2012. 3. 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 Electronically Signed   By: Evangeline Dakin M.D.   On: 06/10/2016 15:38   Ct Chest W Contrast  Result Date: 06/11/2016 CLINICAL DATA:  Abnormal CT abdomen pelvis, left lower lobe mass. Weakness and vomiting and dizziness. EXAM: CT CHEST WITH CONTRAST TECHNIQUE: Multidetector CT imaging of the chest was performed during intravenous contrast administration. CONTRAST:  2m ISOVUE-300 IOPAMIDOL (ISOVUE-300) INJECTION 61% COMPARISON:  CT abdomen pelvis 06/10/2016. FINDINGS: Cardiovascular: Atherosclerotic calcification of the arterial vasculature, including three-vessel involvement of the coronary arteries. Heart size normal. No pericardial effusion. Mediastinum/Nodes: No pathologically enlarged mediastinal, right hilar or axillary lymph nodes. Probable left hilar adenopathy, difficult to measure due to continuity with a large left lower lobe mass. Esophagus is grossly unremarkable. Lungs/Pleura: Heterogeneous left lower lobe mass measures 8.8 x 9.7 cm and crosses the left major fissure. It obstructs the left lower lobe bronchi with mild postobstructive pneumonitis in the peripheral left lower lobe. Associated trace left pleural effusion with pleural thickening and  extrapleural lymph nodes measuring up to 5 mm (image 81). Trace right pleural effusion. Centrilobular emphysema. Debris seen dependently in the trachea. Upper Abdomen: Blush of hyper attenuation is seen in the dome of the liver (series 2, image 132), possibly due to a perfusion anomaly, as it is not seen on 06/10/2016. Subcentimeter low-attenuation lesions in the liver are too small to characterize. Visualized portions of the liver, gallbladder, adrenal glands, kidneys, spleen, pancreas, stomach and bowel are otherwise grossly unremarkable. No upper abdominal adenopathy. Musculoskeletal: No worrisome lytic or sclerotic lesions. IMPRESSION: 1. Large left lower lobe mass crosses the left major fissure and is highly worrisome for primary bronchogenic carcinoma. Tiny left pleural effusion with suspected pleural thickening, worrisome but not definitive for malignant involvement. 2. Trace right pleural effusion. 3. Aortic atherosclerosis (ICD10-170.0). Three-vessel coronary artery calcification. 4.  Emphysema (ICD10-J43.9). Electronically Signed   By: MLorin PicketM.D.   On: 06/11/2016 10:19   Ct Abdomen Pelvis W Contrast  Result Date: 06/10/2016 CLINICAL DATA:  71year old male with history of weakness and vomiting. Dizziness. EXAM: CT ABDOMEN AND PELVIS WITH CONTRAST TECHNIQUE: Multidetector CT imaging of the abdomen and pelvis was performed using the standard protocol following bolus administration of intravenous contrast. CONTRAST:  776mISOVUE-300 IOPAMIDOL (ISOVUE-300) INJECTION 61% COMPARISON:  CT the abdomen pelvis 04/24/2004. FINDINGS: Lower chest: Large mass centered in the left lower lobe incompletely visualized but measuring at least 9.2 x 7.3 cm (image 1 of series 6) highly concerning for primary bronchogenic carcinoma. Trace left pleural effusion, likely malignant. Atherosclerotic calcifications in the left circumflex and right coronary arteries. Hepatobiliary: A few tiny subcentimeter low-attenuation  lesions are noted in the liver, too small to definitively characterize. No large suspicious hepatic lesions are noted. No intra or extrahepatic biliary ductal dilatation. Gallbladder is unremarkable in appearance. Pancreas: No pancreatic mass. No pancreatic ductal dilatation. No pancreatic or peripancreatic fluid or inflammatory changes. Spleen: Calcified granulomas in the spleen. Adrenals/Urinary Tract: 13 mm low-attenuation lesion in the lower pole of the left kidney is compatible with a simple cyst. Right kidney and bilateral adrenal glands are normal in appearance. No hydroureteronephrosis. Urinary bladder is normal in appearance. Stomach/Bowel: The appearance of the stomach is normal. There is no pathologic dilatation of small bowel or colon. Long segment thickening of the colonic wall involving predominantly the descending colon, concerning for colitis. Vascular/Lymphatic: Aortic atherosclerosis, without evidence of aneurysm or dissection. 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 No lymphadenopathy noted in the abdomen or pelvis. Reproductive: Prostate gland and seminal vesicles are unremarkable in appearance. Other: No significant volume of ascites.  No pneumoperitoneum. Musculoskeletal: There are no aggressive appearing lytic or blastic lesions noted in the visualized portions of the skeleton. IMPRESSION: 1. Large incompletely visualized mass centered in the left lower lobe highly concerning for primary bronchogenic neoplasm. Trace left pleural effusion is likely malignant, suggesting M1a disease. Further evaluation with contrast enhanced chest CT is recommended at this time to better evaluate the full extent of thoracic disease. 2. No definite evidence of extra thoracic metastatic disease in the abdomen or pelvis. 3. There are tiny subcentimeter low-attenuation  lesions in the liver which are too small to definitively characterize. Metastatic lesions are not entirely excluded, but are not strongly favored. 4. Long segment colonic wall thickening of the descending colon, concerning for colitis. 5. Aortic atherosclerosis, in addition to at least 2 vessel coronary artery disease. Assessment for potential risk factor modification, dietary therapy or pharmacologic therapy may be warranted, if clinically indicated. 6. Additional incidental findings, as above. Electronically Signed   By: Vinnie Langton M.D.   On: 06/10/2016 15:47    Medications:  Prior to Admission:  Prescriptions Prior to Admission  Medication Sig Dispense Refill Last Dose  . amLODipine (NORVASC) 10 MG tablet Take 10 mg by mouth daily.     Past Week at Unknown time  . enalapril (VASOTEC) 5 MG tablet Take 5 mg by mouth daily.     Past Week at Unknown time  . ferrous sulfate 325 (65 FE) MG tablet Take 1 tablet by mouth daily.  5 Past Week at Unknown time  . LORazepam (ATIVAN) 1 MG tablet Take 1 tablet (1 mg total) by mouth 3 (three) times daily as needed for anxiety (or restlessness). 15 tablet 0 Past Week at Unknown time  . meclizine (ANTIVERT) 25 MG tablet Take 1 tablet (25 mg total) by mouth 3 (three) times daily as needed for dizziness. 15 tablet 0 Past Week at Unknown time  . pravastatin (PRAVACHOL) 40 MG tablet Take 40 mg by mouth daily.     Past Week at Unknown time  . buPROPion (ZYBAN) 150 MG 12 hr tablet One tab po qam for 3 days, then increase to one tab BID for 7 weeks (Patient taking differently: Take 150 mg by mouth 2 (two) times daily. One tab po qam for 3 days, then increase to one tab BID for 7 weeks) 30 tablet 0 Past Week at Unknown time  . COUMADIN 2.5 MG tablet Take 1 tablet by mouth daily.     Marland Kitchen warfarin (COUMADIN) 1 MG tablet Take 1 tablet by mouth daily.   Past Week at Unknown time   Scheduled: . feeding supplement (ENSURE ENLIVE)  237 mL Oral BID BM  . Influenza vac split  quadrivalent PF  0.5 mL Intramuscular Tomorrow-1000  . insulin aspart  0-9 Units Subcutaneous TID WC  . ipratropium-albuterol  3 mL Nebulization TID  . levofloxacin (LEVAQUIN) IV  750 mg Intravenous Q24H  . metroNIDAZOLE  500 mg Intravenous Q8H  . multivitamin with minerals  1 tablet Oral Daily  . pneumococcal 23 valent vaccine  0.5 mL Intramuscular Tomorrow-1000   Continuous:  WER:[id number] **OR** acetaminophen, bisacodyl, ondansetron **OR** ondansetron (ZOFRAN) IV, polyethylene glycol  Assesment: He was admitted with sepsis presumably from  colitis. He is better. He had acute encephalopathy and he is better and able to discuss his situation with me to some extent but I'm not sure that he really understands at this point. CT of the chest which I personally reviewed does show a large mass. This obstructs the left lower lobe bronchi so should be able to be approached with bronchoscopy. Principal Problem:   Sepsis ([place]) Active Problems:   Carotid artery occlusion   Colitis   Mass of left lung   AKI (acute kidney injury) ([place])   Acute encephalopathy   Hypothermia    Plan: When he is clinically better he will need to have bronchoscopy. With his extreme weakness and multiple falls I wonder if he has Iroquois syndrome. Will discuss again when he is more alert.    LOS: 2 days   Cochise Dinneen L 06/12/2016, 7:25 AM

## 2016-06-13 ENCOUNTER — Inpatient Hospital Stay (HOSPITAL_COMMUNITY): Payer: Medicare Other

## 2016-06-13 LAB — BASIC METABOLIC PANEL
ANION GAP: 7 (ref 5–15)
BUN: 11 mg/dL (ref 6–20)
CHLORIDE: 103 mmol/L (ref 101–111)
CO2: 26 mmol/L (ref 22–32)
Calcium: 8.4 mg/dL — ABNORMAL LOW (ref 8.9–10.3)
Creatinine, Ser: 0.91 mg/dL (ref 0.61–1.24)
GFR calc Af Amer: >60 mL/min (ref >60)
GLUCOSE: 94 mg/dL (ref 65–99)
POTASSIUM: 3.1 mmol/L — AB (ref 3.5–5.1)
Sodium: 136 mmol/L (ref 135–145)

## 2016-06-13 LAB — CBC WITH DIFFERENTIAL/PLATELET
Basophils Absolute: 0 10*3/uL (ref 0.0–0.1)
Basophils Relative: 0 %
Eosinophils Absolute: 0.1 10*3/uL (ref 0.0–0.7)
Eosinophils Relative: 2 %
HEMATOCRIT: 29.3 % — AB (ref 39.0–52.0)
Hemoglobin: 9.1 g/dL — ABNORMAL LOW (ref 13.0–17.0)
LYMPHS PCT: 7 %
Lymphs Abs: 0.6 10*3/uL — ABNORMAL LOW (ref 0.7–4.0)
MCH: 25.6 pg — ABNORMAL LOW (ref 26.0–34.0)
MCHC: 31.1 g/dL (ref 30.0–36.0)
MCV: 82.5 fL (ref 78.0–100.0)
MONO ABS: 0.9 10*3/uL (ref 0.1–1.0)
Monocytes Relative: 12 %
NEUTROS ABS: 6.2 10*3/uL (ref 1.7–7.7)
Neutrophils Relative %: 79 %
Platelets: 207 10*3/uL (ref 150–400)
RBC: 3.55 MIL/uL — AB (ref 4.22–5.81)
RDW: 17.1 % — AB (ref 11.5–15.5)
WBC: 7.8 10*3/uL (ref 4.0–10.5)

## 2016-06-13 LAB — GLUCOSE, CAPILLARY
GLUCOSE-CAPILLARY: 114 mg/dL — AB (ref 65–99)
Glucose-Capillary: 92 mg/dL (ref 65–99)
Glucose-Capillary: 104 mg/dL — ABNORMAL HIGH (ref 65–99)
Glucose-Capillary: 120 mg/dL — ABNORMAL HIGH (ref 65–99)

## 2016-06-13 LAB — PROTIME-INR
INR: 1.75
Prothrombin Time: 20.7 seconds — ABNORMAL HIGH (ref 11.4–15.2)

## 2016-06-13 LAB — HIV ANTIBODY (ROUTINE TESTING W REFLEX): HIV SCREEN 4TH GENERATION: NONREACTIVE

## 2016-06-13 LAB — RPR: RPR Ser Ql: NONREACTIVE

## 2016-06-13 MED ORDER — OXYCODONE HCL 5 MG PO TABS
5.0000 mg | ORAL_TABLET | Freq: Four times a day (QID) | ORAL | Status: DC | PRN
  Administered 2016-06-13 – 2016-06-15 (×5): 5 mg via ORAL
  Filled 2016-06-13 (×5): qty 1

## 2016-06-13 MED ORDER — POTASSIUM CHLORIDE CRYS ER 20 MEQ PO TBCR
20.0000 meq | EXTENDED_RELEASE_TABLET | Freq: Two times a day (BID) | ORAL | Status: DC
  Administered 2016-06-13 – 2016-06-20 (×15): 20 meq via ORAL
  Filled 2016-06-13 (×15): qty 1

## 2016-06-13 MED ORDER — ENOXAPARIN SODIUM 40 MG/0.4ML ~~LOC~~ SOLN
40.0000 mg | SUBCUTANEOUS | Status: DC
  Administered 2016-06-13 – 2016-06-20 (×8): 40 mg via SUBCUTANEOUS
  Filled 2016-06-13 (×7): qty 0.4

## 2016-06-13 NOTE — Evaluation (Signed)
Physical Therapy Evaluation Patient Details Name: Frederick [name] MRN: [id number] DOB: 11-Aug-1945 Today's Date: 06/13/2016   History of Present Illness  71 y.o. male with medical history significant for hypertension and carotid artery occlusion with remote CVA status post CEA, now presenting to the emergency department after being found on the floor and poorly responsive by his brother. History is obtained through the patient's brother who also lives next door to him, and through discussion with the ED personnel and review of the medical record. Patient has been suffering recurrent falls for the past month, has reportedly not been eating much at all or taking his medications over this interval, and has lost 30 pounds. He has complained of abdominal discomfort during this period and has had repeated nausea and vomiting. He was evaluated in the emergency department for this previously and was diagnosed with vertigo. His brother reports that every time he checks on the patient, he finds him on the floor. He checked on him today, found him on the floor and only minimally responsive and he was brought into the ED for evaluation.   Dx: sepsis due to colitis, L lung mass, and Acute encephalopathy  Clinical Impression  Pt received in bed, and was agreeable to PT evaluation.  Pt expressed that he is normally independent with ambulation, however, he has had at least 3 falls in the past 6 months. He also reports that he is independent with dressing and bathing.  During PT evaluation, he required Mod A for supine<>sit, and Mod A for stand pivot transfer bed<>chair due to poor safety awareness and attempting to prematurely sit in the chair when he was not yet lined up.  Pt is recommended for SNF at this time due to increased need for assistance with all functional mobility tasks, and he lives alone.      Follow Up Recommendations SNF    Equipment Recommendations  None recommended by PT    Recommendations for Other  Services       Precautions / Restrictions Precautions Precautions: Fall Precaution Comments: 3 falls per the patient Restrictions Weight Bearing Restrictions: No      Mobility  Bed Mobility Overal bed mobility: Needs Assistance Bed Mobility: Supine to Sit     Supine to sit: Mod assist;HOB elevated     General bed mobility comments: increased time.   Transfers Overall transfer level: Needs assistance Equipment used: Rolling walker (2 wheeled) Transfers: Sit to/from [facility] Sit to Stand: Mod assist Stand pivot transfers: Mod assist       General transfer comment: Pt demonstrates forward flexed posture, and requires tc's to obtain upright posture.  However when taking steps to turn and sit in the chair, he is not able to maintain  good posture.  Pt required increased assistance on descent into the chair due to poor safety awareness and not getting lined up with the chair initially.    Ambulation/Gait                Stairs            Wheelchair Mobility    Modified Rankin (Stroke Patients Only)       Balance Overall balance assessment: History of Falls;Needs assistance Sitting-balance support: Feet supported;Bilateral upper extremity supported Sitting balance-Leahy Scale: Fair     Standing balance support: Bilateral upper extremity supported Standing balance-Leahy Scale: Poor  Pertinent Vitals/Pain Pain Assessment: 0-10 Pain Score: 5  Pain Location: L LE and foot, and R shoulder.   Pain Descriptors / Indicators: Aching Pain Intervention(s): Limited activity within patient's tolerance;Monitored during session;RN gave pain meds during session    Home Living   Living Arrangements: Alone (brother lives next door.  ) Available Help at Discharge: Family;Neighbor (sister in law brings in the mail.  and neighbors check in every day. )   Home Access: Stairs to enter   [employer] of Steps: 5 steps on the back porch, and 2 steps on the front.  Home Layout: One level Home Equipment: None      Prior Function Level of Independence: Independent   Gait / Transfers Assistance Needed: independent per pt report.   ADL's / Homemaking Assistance Needed: independent per pt report.         Hand Dominance   Dominant Hand: Right    Extremity/Trunk Assessment   Upper Extremity Assessment Upper Extremity Assessment: Generalized weakness    Lower Extremity Assessment Lower Extremity Assessment: Generalized weakness       Communication      Cognition Arousal/Alertness: Awake/alert Behavior During Therapy: WFL for tasks assessed/performed Overall Cognitive Status: Within Functional Limits for tasks assessed                 General Comments: Although he is able to answer orientation questions correctly, he does not demonstrate appropriate safety awareness or judgement.      General Comments      Exercises     Assessment/Plan    PT Assessment Patient needs continued PT services  PT Problem List Decreased strength;Decreased activity tolerance;Decreased balance;Decreased mobility;Decreased coordination;Decreased cognition;Decreased knowledge of use of DME;Decreased safety awareness;Decreased knowledge of precautions;Cardiopulmonary status limiting activity;Pain          PT Treatment Interventions DME instruction;Gait training;Functional mobility training;Therapeutic activities;Therapeutic exercise;Balance training;Patient/family education    PT Goals (Current goals can be found in the Care Plan section)  Acute Rehab PT Goals Patient Stated Goal: To get stronger PT Goal Formulation: With patient Time For Goal Achievement: 06/20/16 Potential to Achieve Goals: Fair    Frequency Min 3X/week   Barriers to discharge Decreased caregiver support      Co-evaluation               End of Session Equipment Utilized During Treatment:  Gait belt Activity Tolerance: Patient limited by fatigue Patient left: in chair;with call bell/phone within reach Nurse Communication: Mobility status;Need for lift equipment Evelena Peat, RN notified of pt's location and mobiltiy status. Recommend use of STEDY for transfers with nursing staff.  )    Functional Assessment Tool Used: [place] "6-clicks"  Functional Limitation: Mobility: Walking and moving around Mobility: Walking and Moving Around Current Status [phone]): At least 40 percent but less than 60 percent impaired, limited or restricted Mobility: Walking and Moving Around Goal Status [phone]): At least 20 percent but less than 40 percent impaired, limited or restricted    Time: 1532-1600 PT Time Calculation (min) (ACUTE ONLY): 28 min   Charges:   PT Evaluation $PT Eval Moderate Complexity: 1 Procedure PT Treatments $Therapeutic Activity: 8-22 mins   PT G Codes:   PT G-Codes **NOT FOR INPATIENT CLASS** Functional Assessment Tool Used: [employer] "6-clicks"  Functional Limitation: Mobility: Walking and moving around Mobility: Walking and Moving Around Current Status [phone]): At least 40 percent but less than 60 percent impaired, limited or restricted Mobility: Walking and Moving Around Goal Status (  [id number]): At least 20 percent but less than 40 percent impaired, limited or restricted    Frederick [name], PT, DPT X: [phone]

## 2016-06-13 NOTE — Progress Notes (Addendum)
Subjective: He says he feels better. He has pain in his left leg. He has no other new complaints. No chest pain. No hemoptysis. No abdominal pain.  Objective: Vital signs in last 24 hours: Temp:  [97.8 F (36.6 C)-98.4 F (36.9 C)] 98.4 F (36.9 C) (01/10 0400) Pulse Rate:  [73-96] 77 (01/10 0600) Resp:  [13-25] 16 (01/10 0600) BP: (83-103)/(56-74) 94/62 (01/10 0600) SpO2:  [92 %-100 %] 95 % (01/10 0600) Weight:  [53.5 kg (117 lb 15.1 oz)] 53.5 kg (117 lb 15.1 oz) (01/10 0500) Weight change: 0 kg (0 lb) Last BM Date: 06/12/16  Intake/Output from previous day: 01/09 0701 - 01/10 0700 In: 1090 [P.O.:240; IV Piggyback:850] Out: 401 [Urine:400; Stool:1]  PHYSICAL EXAM General appearance: alert and mild distress Resp: rhonchi bilaterally Cardio: regular rate and rhythm, S1, S2 normal, no murmur, click, rub or gallop GI: soft, non-tender; bowel sounds normal; no masses,  no organomegaly Extremities: extremities normal, atraumatic, no cyanosis or edema Skin warm and dry. Mucous membranes are moist. Pupils react.  Lab Results:  Results for orders placed or performed during the hospital encounter of 06/10/16 (from the past 48 hour(s))  Glucose, capillary     Status: Abnormal   Collection Time: 06/11/16 11:05 AM  Result Value Ref Range   Glucose-Capillary 115 (H) 65 - 99 mg/dL   Comment 1 Notify RN    Comment 2 Document in Chart   Glucose, capillary     Status: None   Collection Time: 06/11/16  4:58 PM  Result Value Ref Range   Glucose-Capillary 84 65 - 99 mg/dL  Glucose, capillary     Status: None   Collection Time: 06/11/16  9:32 PM  Result Value Ref Range   Glucose-Capillary 93 65 - 99 mg/dL  Basic metabolic panel     Status: Abnormal   Collection Time: 06/12/16  4:41 AM  Result Value Ref Range   Sodium 138 135 - 145 mmol/L   Potassium 2.8 (L) 3.5 - 5.1 mmol/L   Chloride 106 101 - 111 mmol/L   CO2 27 22 - 32 mmol/L   Glucose, Bld 89 65 - 99 mg/dL   BUN 12 6 - 20 mg/dL    Creatinine, Ser 0.95 0.61 - 1.24 mg/dL   Calcium 8.8 (L) 8.9 - 10.3 mg/dL   GFR calc non Af Amer >60 >60 mL/min   GFR calc Af Amer >60 >60 mL/min    Comment: (NOTE) The eGFR has been calculated using the CKD EPI equation. This calculation has not been validated in all clinical situations. eGFR's persistently <60 mL/min signify possible Chronic Kidney Disease.    Anion gap 5 5 - 15  CBC with Differential/Platelet     Status: Abnormal   Collection Time: 06/12/16  4:41 AM  Result Value Ref Range   WBC 9.2 4.0 - 10.5 K/uL   RBC 3.73 (L) 4.22 - 5.81 MIL/uL   Hemoglobin 9.3 (L) 13.0 - 17.0 g/dL   HCT 30.8 (L) 39.0 - 52.0 %   MCV 82.6 78.0 - 100.0 fL   MCH 24.9 (L) 26.0 - 34.0 pg   MCHC 30.2 30.0 - 36.0 g/dL   RDW 17.0 (H) 11.5 - 15.5 %   Platelets 221 150 - 400 K/uL   Neutrophils Relative % 83 %   Lymphocytes Relative 7 %   Monocytes Relative 9 %   Eosinophils Relative 1 %   Basophils Relative 0 %   Neutro Abs 7.7 1.7 - 7.7 K/uL   Lymphs  Abs 0.6 (L) 0.7 - 4.0 K/uL   Monocytes Absolute 0.8 0.1 - 1.0 K/uL   Eosinophils Absolute 0.1 0.0 - 0.7 K/uL   Basophils Absolute 0.0 0.0 - 0.1 K/uL  Protime-INR     Status: Abnormal   Collection Time: 06/12/16  4:41 AM  Result Value Ref Range   Prothrombin Time 25.4 (H) 11.4 - 15.2 seconds   INR 2.27   Glucose, capillary     Status: None   Collection Time: 06/12/16  7:41 AM  Result Value Ref Range   Glucose-Capillary 86 65 - 99 mg/dL   Comment 1 Notify RN    Comment 2 Document in Chart   Glucose, capillary     Status: Abnormal   Collection Time: 06/12/16 11:13 AM  Result Value Ref Range   Glucose-Capillary 181 (H) 65 - 99 mg/dL   Comment 1 Notify RN    Comment 2 Document in Chart   RPR     Status: None   Collection Time: 06/12/16 12:35 PM  Result Value Ref Range   RPR Ser Ql Non Reactive Non Reactive    Comment: (NOTE) Performed At: [facility] [place], [place] [id number] Lindon Romp MD  WU:[id number]   Sedimentation rate     Status: Abnormal   Collection Time: 06/12/16 12:35 PM  Result Value Ref Range   Sed Rate 55 (H) 0 - 16 mm/hr  CBC     Status: Abnormal   Collection Time: 06/12/16 12:35 PM  Result Value Ref Range   WBC 8.4 4.0 - 10.5 K/uL   RBC 3.53 (L) 4.22 - 5.81 MIL/uL   Hemoglobin 9.2 (L) 13.0 - 17.0 g/dL   HCT 28.9 (L) 39.0 - 52.0 %   MCV 81.9 78.0 - 100.0 fL   MCH 26.1 26.0 - 34.0 pg   MCHC 31.8 30.0 - 36.0 g/dL   RDW 17.2 (H) 11.5 - 15.5 %   Platelets 204 150 - 400 K/uL  Vitamin B12     Status: None   Collection Time: 06/12/16 12:43 PM  Result Value Ref Range   Vitamin B-12 268 180 - 914 pg/mL    Comment: (NOTE) This assay is not validated for testing neonatal or myeloproliferative syndrome specimens for Vitamin B12 levels. Performed at [facility]   TSH     Status: None   Collection Time: 06/12/16 12:43 PM  Result Value Ref Range   TSH 1.686 0.350 - 4.500 uIU/mL    Comment: Performed by a 3rd Generation assay with a functional sensitivity of <=0.01 uIU/mL.  Folate     Status: Abnormal   Collection Time: 06/12/16 12:43 PM  Result Value Ref Range   Folate 5.4 (L) >5.9 ng/mL    Comment: Performed at [facility]  Glucose, capillary     Status: None   Collection Time: 06/12/16  3:36 PM  Result Value Ref Range   Glucose-Capillary 86 65 - 99 mg/dL   Comment 1 Notify RN    Comment 2 Document in Chart   Glucose, capillary     Status: Abnormal   Collection Time: 06/12/16  9:57 PM  Result Value Ref Range   Glucose-Capillary 130 (H) 65 - 99 mg/dL  Basic metabolic panel     Status: Abnormal   Collection Time: 06/13/16  4:18 AM  Result Value Ref Range   Sodium 136 135 - 145 mmol/L   Potassium 3.1 (L) 3.5 - 5.1 mmol/L   Chloride 103 101 - 111  mmol/L   CO2 26 22 - 32 mmol/L   Glucose, Bld 94 65 - 99 mg/dL   BUN 11 6 - 20 mg/dL   Creatinine, Ser 0.91 0.61 - 1.24 mg/dL   Calcium 8.4 (L) 8.9 - 10.3 mg/dL   GFR calc non Af Amer >60  >60 mL/min   GFR calc Af Amer >60 >60 mL/min    Comment: (NOTE) The eGFR has been calculated using the CKD EPI equation. This calculation has not been validated in all clinical situations. eGFR's persistently <60 mL/min signify possible Chronic Kidney Disease.    Anion gap 7 5 - 15  CBC with Differential/Platelet     Status: Abnormal   Collection Time: 06/13/16  4:18 AM  Result Value Ref Range   WBC 7.8 4.0 - 10.5 K/uL   RBC 3.55 (L) 4.22 - 5.81 MIL/uL   Hemoglobin 9.1 (L) 13.0 - 17.0 g/dL   HCT 29.3 (L) 39.0 - 52.0 %   MCV 82.5 78.0 - 100.0 fL   MCH 25.6 (L) 26.0 - 34.0 pg   MCHC 31.1 30.0 - 36.0 g/dL   RDW 17.1 (H) 11.5 - 15.5 %   Platelets 207 150 - 400 K/uL   Neutrophils Relative % 79 %   Neutro Abs 6.2 1.7 - 7.7 K/uL   Lymphocytes Relative 7 %   Lymphs Abs 0.6 (L) 0.7 - 4.0 K/uL   Monocytes Relative 12 %   Monocytes Absolute 0.9 0.1 - 1.0 K/uL   Eosinophils Relative 2 %   Eosinophils Absolute 0.1 0.0 - 0.7 K/uL   Basophils Relative 0 %   Basophils Absolute 0.0 0.0 - 0.1 K/uL  Protime-INR     Status: Abnormal   Collection Time: 06/13/16  4:18 AM  Result Value Ref Range   Prothrombin Time 20.7 (H) 11.4 - 15.2 seconds   INR 1.75     ABGS No results for input(s): PHART, PO2ART, TCO2, HCO3 in the last 72 hours.  Invalid input(s): PCO2 CULTURES Recent Results (from the past 240 hour(s))  Blood culture (routine x 2)     Status: None (Preliminary result)   Collection Time: 06/10/16  2:16 PM  Result Value Ref Range Status   Specimen Description BLOOD LEFT ANTECUBITAL  Final   Special Requests BOTTLES DRAWN AEROBIC AND ANAEROBIC 8CC  Final   Culture NO GROWTH 2 DAYS  Final   Report Status PENDING  Incomplete  Blood culture (routine x 2)     Status: None (Preliminary result)   Collection Time: 06/10/16  2:24 PM  Result Value Ref Range Status   Specimen Description BLOOD  Final   Special Requests NONE  Final   Culture NO GROWTH 2 DAYS  Final   Report Status PENDING   Incomplete  MRSA PCR Screening     Status: None   Collection Time: 06/10/16  5:17 PM  Result Value Ref Range Status   MRSA by PCR NEGATIVE NEGATIVE Final    Comment:        The GeneXpert MRSA Assay (FDA approved for NASAL specimens only), is one component of a comprehensive MRSA colonization surveillance program. It is not intended to diagnose MRSA infection nor to guide or monitor treatment for MRSA infections.    Studies/Results: Ct Chest W Contrast  Result Date: 06/11/2016 CLINICAL DATA:  Abnormal CT abdomen pelvis, left lower lobe mass. Weakness and vomiting and dizziness. EXAM: CT CHEST WITH CONTRAST TECHNIQUE: Multidetector CT imaging of the chest was performed during intravenous contrast administration. CONTRAST:  25m ISOVUE-300 IOPAMIDOL (ISOVUE-300) INJECTION 61% COMPARISON:  CT abdomen pelvis 06/10/2016. FINDINGS: Cardiovascular: Atherosclerotic calcification of the arterial vasculature, including three-vessel involvement of the coronary arteries. Heart size normal. No pericardial effusion. Mediastinum/Nodes: No pathologically enlarged mediastinal, right hilar or axillary lymph nodes. Probable left hilar adenopathy, difficult to measure due to continuity with a large left lower lobe mass. Esophagus is grossly unremarkable. Lungs/Pleura: Heterogeneous left lower lobe mass measures 8.8 x 9.7 cm and crosses the left major fissure. It obstructs the left lower lobe bronchi with mild postobstructive pneumonitis in the peripheral left lower lobe. Associated trace left pleural effusion with pleural thickening and extrapleural lymph nodes measuring up to 5 mm (image 81). Trace right pleural effusion. Centrilobular emphysema. Debris seen dependently in the trachea. Upper Abdomen: Blush of hyper attenuation is seen in the dome of the liver (series 2, image 132), possibly due to a perfusion anomaly, as it is not seen on 06/10/2016. Subcentimeter low-attenuation lesions in the liver are too small to  characterize. Visualized portions of the liver, gallbladder, adrenal glands, kidneys, spleen, pancreas, stomach and bowel are otherwise grossly unremarkable. No upper abdominal adenopathy. Musculoskeletal: No worrisome lytic or sclerotic lesions. IMPRESSION: 1. Large left lower lobe mass crosses the left major fissure and is highly worrisome for primary bronchogenic carcinoma. Tiny left pleural effusion with suspected pleural thickening, worrisome but not definitive for malignant involvement. 2. Trace right pleural effusion. 3. Aortic atherosclerosis (ICD10-170.0). Three-vessel coronary artery calcification. 4.  Emphysema (ICD10-J43.9). Electronically Signed   By: MLorin PicketM.D.   On: 06/11/2016 10:19    Medications:  Prior to Admission:  Prescriptions Prior to Admission  Medication Sig Dispense Refill Last Dose  . amLODipine (NORVASC) 10 MG tablet Take 10 mg by mouth daily.     Past Week at Unknown time  . enalapril (VASOTEC) 5 MG tablet Take 5 mg by mouth daily.     Past Week at Unknown time  . ferrous sulfate 325 (65 FE) MG tablet Take 1 tablet by mouth daily.  5 Past Week at Unknown time  . LORazepam (ATIVAN) 1 MG tablet Take 1 tablet (1 mg total) by mouth 3 (three) times daily as needed for anxiety (or restlessness). 15 tablet 0 Past Week at Unknown time  . meclizine (ANTIVERT) 25 MG tablet Take 1 tablet (25 mg total) by mouth 3 (three) times daily as needed for dizziness. 15 tablet 0 Past Week at Unknown time  . pravastatin (PRAVACHOL) 40 MG tablet Take 40 mg by mouth daily.     Past Week at Unknown time  . buPROPion (ZYBAN) 150 MG 12 hr tablet One tab po qam for 3 days, then increase to one tab BID for 7 weeks (Patient taking differently: Take 150 mg by mouth 2 (two) times daily. One tab po qam for 3 days, then increase to one tab BID for 7 weeks) 30 tablet 0 Past Week at Unknown time  . COUMADIN 2.5 MG tablet Take 1 tablet by mouth daily.     .Marland Kitchenwarfarin (COUMADIN) 1 MG tablet Take 1  tablet by mouth daily.   Past Week at Unknown time   Scheduled: . feeding supplement (ENSURE ENLIVE)  237 mL Oral BID BM  . Influenza vac split quadrivalent PF  0.5 mL Intramuscular Tomorrow-1000  . insulin aspart  0-9 Units Subcutaneous TID WC  . levofloxacin (LEVAQUIN) IV  750 mg Intravenous Q24H  . metroNIDAZOLE  500 mg Intravenous Q8H  . multivitamin with minerals  1 tablet Oral  Daily  . pneumococcal 23 valent vaccine  0.5 mL Intramuscular Tomorrow-1000  . potassium chloride  20 mEq Oral BID   Continuous:  YQI:[id number] **OR** acetaminophen, bisacodyl, ipratropium-albuterol, ondansetron **OR** ondansetron (ZOFRAN) IV, polyethylene glycol  Assesment: He came in with sepsis presumably from colitis. He is still mildly hypotensive but clearly better. He has a mass in his left lung presumably lung cancer. I discussed his situation with Dr. Lorriane Shire and plan will be for bronchoscopy. Mr. Tulloch requests that this be done as an outpatient. Discussed with his nurse this morning and she says he's not been able to do anything for himself so I think we should get physical therapy consultation. I don't go that he would agree to nursing home placement but I think we should at least see if we can get him more able to move. Palliative care consultation is a consideration. Principal Problem:   Sepsis ([place]) Active Problems:   Carotid artery occlusion   Colitis   Mass of left lung   AKI (acute kidney injury) ([place])   Acute encephalopathy   Hypothermia    Plan: I took the liberty of ordering the PT consultation. We'll get him scheduled for outpatient bronchoscopy. He says he would proceed with radiation/chemotherapy if he is found to have lung cancer.    LOS: 3 days   Oma Marzan L 06/13/2016, 7:32 AM

## 2016-06-13 NOTE — Progress Notes (Addendum)
Pharmacy Anticoagulation and Antibiotic Note  Frederick [name] is a 71 y.o. male admitted on 06/10/2016 with intra abdominal infection.  Pharmacy has been consulted for levaquin and flagyl dosing.  Lovenox for VTE prophylaxis  Plan: Cont flagyl 500 mg IV q8 hours Cont levaquin 750 mg IV q24 hours Lovenox '40mg'$  SQ q24hrs F/u renal function, cultures and clinical course  Height: '5\' 10"'$  (177.8 cm) Weight: 117 lb 15.1 oz (53.5 kg) IBW/kg (Calculated) : 73  Temp (24hrs), Avg:98.1 F (36.7 C), Min:97.6 F (36.4 C), Max:98.4 F (36.9 C)   Recent Labs Lab 06/06/16 1113 06/10/16 1337 06/10/16 1347 06/10/16 1416 06/10/16 2029 06/11/16 0418 06/12/16 0441 06/12/16 1235 06/13/16 0418  WBC 10.6* 25.0*  --   --   --  13.5* 9.2 8.4 7.8  CREATININE 0.96 1.43*  --   --   --  0.94 0.95  --  0.91  LATICACIDVEN  --   --  4.90* 3.3* 1.5  --   --   --   --     Estimated Creatinine Clearance: 56.3 mL/min (by C-G formula based on SCr of 0.91 mg/dL).    Allergies  Allergen Reactions  . Penicillins Nausea Only    Has patient had a PCN reaction causing immediate rash, facial/tongue/throat swelling, SOB or lightheadedness with hypotension: unknown Has patient had a PCN reaction causing severe rash involving mucus membranes or skin necrosis: unknown Has patient had a PCN reaction that required hospitalization: unknown Has patient had a PCN reaction occurring within the last 10 years: no If all of the above answers are "NO", then may proceed with Cephalosporin use.    Antimicrobials this admission: flagyl 1/7 >>  levaquin 1/7 >>   Thank you for allowing pharmacy to be a part of this patient's care.  Hart Robinsons A 06/13/2016 10:11 AM

## 2016-06-13 NOTE — Progress Notes (Signed)
Patient clinically improved on dual antibiotics awaiting this morning's potassium have ordered 40 mEq of by mouth potassium daily beginning today dementia workup unremarkable except for mildly diminished. Folate level. We'll monitor be met daily as patient improves pulmonary will decide on a course of obtaining tissue and then the palliative treatments. KERRIE LATOUR FCS:[id number] DOB: 04-08-1946 DOA: 06/10/2016 PCP: Eula Listen, MD   Physical Exam: Blood pressure 94/62, pulse 77, temperature 98.4 F (36.9 C), temperature source Oral, resp. rate 16, height 5\' 10"  (1.778 m), weight 53.5 kg (117 lb 15.1 oz), SpO2 95 %. Lungs diminished breath sounds in bases no rales wheeze rhonchi appreciable heart regular rhythm no S3 or S4 no heaves thrills rubs   Investigations:  Recent Results (from the past 240 hour(s))  Blood culture (routine x 2)     Status: None (Preliminary result)   Collection Time: 06/10/16  2:16 PM  Result Value Ref Range Status   Specimen Description BLOOD LEFT ANTECUBITAL  Final   Special Requests BOTTLES DRAWN AEROBIC AND ANAEROBIC 8CC  Final   Culture NO GROWTH 2 DAYS  Final   Report Status PENDING  Incomplete  Blood culture (routine x 2)     Status: None (Preliminary result)   Collection Time: 06/10/16  2:24 PM  Result Value Ref Range Status   Specimen Description BLOOD  Final   Special Requests NONE  Final   Culture NO GROWTH 2 DAYS  Final   Report Status PENDING  Incomplete  MRSA PCR Screening     Status: None   Collection Time: 06/10/16  5:17 PM  Result Value Ref Range Status   MRSA by PCR NEGATIVE NEGATIVE Final    Comment:        The GeneXpert MRSA Assay (FDA approved for NASAL specimens only), is one component of a comprehensive MRSA colonization surveillance program. It is not intended to diagnose MRSA infection nor to guide or monitor treatment for MRSA infections.      Basic Metabolic Panel:  Recent Labs  08/08/16 0418 06/12/16 0441  NA 143 138   K 2.9* 2.8*  CL 109 106  CO2 27 27  GLUCOSE 90 89  BUN 19 12  CREATININE 0.94 0.95  CALCIUM 8.7* 8.8*   Liver Function Tests:  Recent Labs  06/10/16 1337 06/11/16 0418  AST 36 28  ALT 14* 9*  ALKPHOS 85 56  BILITOT 1.0 0.6  PROT 7.9 5.2*  ALBUMIN 3.2* 2.1*     CBC:  Recent Labs  06/11/16 0418 06/12/16 0441 06/12/16 1235  WBC 13.5* 9.2 8.4  NEUTROABS 11.5* 7.7  --   HGB 10.4* 9.3* 9.2*  HCT 32.5* 30.8* 28.9*  MCV 83.8 82.6 81.9  PLT 258 221 204    Ct Chest W Contrast  Result Date: 06/11/2016 CLINICAL DATA:  Abnormal CT abdomen pelvis, left lower lobe mass. Weakness and vomiting and dizziness. EXAM: CT CHEST WITH CONTRAST TECHNIQUE: Multidetector CT imaging of the chest was performed during intravenous contrast administration. CONTRAST:  63mL ISOVUE-300 IOPAMIDOL (ISOVUE-300) INJECTION 61% COMPARISON:  CT abdomen pelvis 06/10/2016. FINDINGS: Cardiovascular: Atherosclerotic calcification of the arterial vasculature, including three-vessel involvement of the coronary arteries. Heart size normal. No pericardial effusion. Mediastinum/Nodes: No pathologically enlarged mediastinal, right hilar or axillary lymph nodes. Probable left hilar adenopathy, difficult to measure due to continuity with a large left lower lobe mass. Esophagus is grossly unremarkable. Lungs/Pleura: Heterogeneous left lower lobe mass measures 8.8 x 9.7 cm and crosses the left major fissure. It obstructs the  left lower lobe bronchi with mild postobstructive pneumonitis in the peripheral left lower lobe. Associated trace left pleural effusion with pleural thickening and extrapleural lymph nodes measuring up to 5 mm (image 81). Trace right pleural effusion. Centrilobular emphysema. Debris seen dependently in the trachea. Upper Abdomen: Blush of hyper attenuation is seen in the dome of the liver (series 2, image 132), possibly due to a perfusion anomaly, as it is not seen on 06/10/2016. Subcentimeter low-attenuation  lesions in the liver are too small to characterize. Visualized portions of the liver, gallbladder, adrenal glands, kidneys, spleen, pancreas, stomach and bowel are otherwise grossly unremarkable. No upper abdominal adenopathy. Musculoskeletal: No worrisome lytic or sclerotic lesions. IMPRESSION: 1. Large left lower lobe mass crosses the left major fissure and is highly worrisome for primary bronchogenic carcinoma. Tiny left pleural effusion with suspected pleural thickening, worrisome but not definitive for malignant involvement. 2. Trace right pleural effusion. 3. Aortic atherosclerosis (ICD10-170.0). Three-vessel coronary artery calcification. 4.  Emphysema (ICD10-J43.9). Electronically Signed   By: Lorin Picket M.D.   On: 06/11/2016 10:19      Medications:   Impression:  Principal Problem:   Sepsis ([place]) Active Problems:   Carotid artery occlusion   Colitis   Mass of left lung   AKI (acute kidney injury) ([place])   Acute encephalopathy   Hypothermia     Plan: KCl 40 mEq by mouth daily beginning today   Consultants: Gastroenterology and pulmonology    Procedures   Antibiotics: Flagyl and Levaquin         Time spent: 30 minutes   LOS: 3 days   Laresha Bacorn M   06/13/2016, 6:29 AM

## 2016-06-13 NOTE — Plan of Care (Signed)
Problem: Tissue Perfusion: Goal: Risk factors for ineffective tissue perfusion will decrease Outcome: Progressing Patient wearing bilateral SCds and receiving daily lovenox for dvt prevention

## 2016-06-13 NOTE — Progress Notes (Signed)
REVIEWED-NO ADDITIONAL RECOMMENDATIONS.  Subjective:  Patient denies abdominal pain or BM today.   Objective: Vital signs in last 24 hours: Temp:  [97.6 F (36.4 C)-98.4 F (36.9 C)] 97.6 F (36.4 C) (01/10 0755) Pulse Rate:  [73-96] 77 (01/10 0600) Resp:  [13-25] 16 (01/10 0600) BP: (83-103)/(56-74) 94/62 (01/10 0600) SpO2:  [92 %-100 %] 95 % (01/10 0600) Weight:  [117 lb 15.1 oz (53.5 kg)] 117 lb 15.1 oz (53.5 kg) (01/10 0500) Last BM Date: 06/12/16 General:   Alert,  Well-developed, well-nourished, pleasant and cooperative in NAD Head:  Normocephalic and atraumatic. Eyes:  Sclera clear, no icterus.  Abdomen:  Soft, nontender and nondistended.  Normal bowel sounds, without guarding, and without rebound.   Extremities:  Without clubbing, deformity or edema. Neurologic:  Alert and  oriented x4;  grossly normal neurologically. Skin:  Intact without significant lesions or rashes. Psych:  Alert and cooperative. Normal mood and affect.  Intake/Output from previous day: 01/09 0701 - 01/10 0700 In: 1090 [P.O.:240; IV Piggyback:850] Out: 401 [Urine:400; Stool:1] Intake/Output this shift: No intake/output data recorded.  Lab Results: CBC  Recent Labs  06/12/16 0441 06/12/16 1235 06/13/16 0418  WBC 9.2 8.4 7.8  HGB 9.3* 9.2* 9.1*  HCT 30.8* 28.9* 29.3*  MCV 82.6 81.9 82.5  PLT 221 204 207   BMET  Recent Labs  06/11/16 0418 06/12/16 0441 06/13/16 0418  NA 143 138 136  K 2.9* 2.8* 3.1*  CL 109 106 103  CO2 '27 27 26  '$ GLUCOSE 90 89 94  BUN '19 12 11  '$ CREATININE 0.94 0.95 0.91  CALCIUM 8.7* 8.8* 8.4*   LFTs  Recent Labs  06/10/16 1337 06/11/16 0418  BILITOT 1.0 0.6  ALKPHOS 85 56  AST 36 28  ALT 14* 9*  PROT 7.9 5.2*  ALBUMIN 3.2* 2.1*   No results for input(s): LIPASE in the last 72 hours. PT/INR  Recent Labs  06/11/16 0418 06/12/16 0441 06/13/16 0418  LABPROT 34.6* 25.4* 20.7*  INR 3.34 2.27 1.75      Imaging Studies: Dg Chest 1  View  Result Date: 06/10/2016 CLINICAL DATA:  Acute mental status change. EXAM: CHEST 1 VIEW COMPARISON:  September 28, 2009 FINDINGS: There is a focal infiltrate in the left base which demonstrates a somewhat rounded lateral margin. No other interval changes or acute abnormalities. IMPRESSION: Somewhat rounded focal infiltrate in the left base medially. If the patient has signs of infection, recommend treatment with short-term follow-up. If the patient does not have signs for infection, recommend CT imaging. Electronically Signed   By: Dorise Bullion III M.D   On: 06/10/2016 15:11   Ct Head Wo Contrast  Result Date: 06/10/2016 CLINICAL DATA:  71 year old with four-day history of generalized weakness, vomiting, dizziness and anorexia with multiple falls. Patient lives alone but has not been able to care for himself over the past 4 days. EXAM: CT HEAD WITHOUT CONTRAST TECHNIQUE: Contiguous axial images were obtained from the base of the skull through the vertex without intravenous contrast. COMPARISON:  MRI brain 06/06/2016, 05/31/2008. CT head 06/06/2016, 08/27/2010, 05/29/2008. FINDINGS: Brain: Severe cortical atrophy, moderate deep atrophy and severe cerebellar atrophy, progressive since 2012. Encephalomalacia involving the left posterior frontal and superior temporal lobes at the site of the prior stroke, unchanged since 2012. Severe changes of small vessel disease of the white matter diffusely, progressive since 2012. No mass lesion. No midline shift. No acute hemorrhage or hematoma. No extra-axial fluid collections. No evidence of acute infarction. Vascular: Severe  bilateral carotid siphon and mild left vertebral artery atherosclerosis. Skull: No skull fracture or other focal osseous abnormality involving the skull. Sinuses/Orbits: Visualized paranasal sinuses, bilateral mastoid air cells and bilateral middle ear cavities well-aerated. Visualized orbits normal. Other: None. IMPRESSION: 1. No acute intracranial  abnormality. 2. Severe generalized atrophy and severe chronic microvascular ischemic changes of the white matter diffusely, progressive since 2012. 3. 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 Electronically Signed   By: Evangeline Dakin M.D.   On: 06/10/2016 15:38   Ct Head Wo Contrast  Result Date: 06/06/2016 CLINICAL DATA:  Dizziness, nausea, vomiting for past 4-5 days. EXAM: CT HEAD WITHOUT CONTRAST TECHNIQUE: Contiguous axial images were obtained from the base of the skull through the vertex without intravenous contrast. COMPARISON:  08/27/2010 FINDINGS: Brain: Old left infarct in the region of the insular cortex with encephalomalacia. There is atrophy and chronic small vessel disease changes. No acute intracranial abnormality. Specifically, no hemorrhage, hydrocephalus, mass lesion, acute infarction, or significant intracranial injury. Vascular: No hyperdense vessel or unexpected calcification. Skull: No acute calvarial abnormality. Sinuses/Orbits: Visualized paranasal sinuses and mastoids clear. Orbital soft tissues unremarkable. Other: None IMPRESSION: Old left MCA infarct. Atrophy, chronic small vessel disease. No acute intracranial abnormality. Electronically Signed   By: Rolm Baptise M.D.   On: 06/06/2016 12:02   Ct Chest W Contrast  Result Date: 06/11/2016 CLINICAL DATA:  Abnormal CT abdomen pelvis, left lower lobe mass. Weakness and vomiting and dizziness. EXAM: CT CHEST WITH CONTRAST TECHNIQUE: Multidetector CT imaging of the chest was performed during intravenous contrast administration. CONTRAST:  42m ISOVUE-300 IOPAMIDOL (ISOVUE-300) INJECTION 61% COMPARISON:  CT abdomen pelvis 06/10/2016. FINDINGS: Cardiovascular: Atherosclerotic calcification of the arterial vasculature, including three-vessel involvement of the coronary arteries. Heart size normal. No pericardial effusion. Mediastinum/Nodes: No pathologically enlarged mediastinal, right hilar or axillary lymph  nodes. Probable left hilar adenopathy, difficult to measure due to continuity with a large left lower lobe mass. Esophagus is grossly unremarkable. Lungs/Pleura: Heterogeneous left lower lobe mass measures 8.8 x 9.7 cm and crosses the left major fissure. It obstructs the left lower lobe bronchi with mild postobstructive pneumonitis in the peripheral left lower lobe. Associated trace left pleural effusion with pleural thickening and extrapleural lymph nodes measuring up to 5 mm (image 81). Trace right pleural effusion. Centrilobular emphysema. Debris seen dependently in the trachea. Upper Abdomen: Blush of hyper attenuation is seen in the dome of the liver (series 2, image 132), possibly due to a perfusion anomaly, as it is not seen on 06/10/2016. Subcentimeter low-attenuation lesions in the liver are too small to characterize. Visualized portions of the liver, gallbladder, adrenal glands, kidneys, spleen, pancreas, stomach and bowel are otherwise grossly unremarkable. No upper abdominal adenopathy. Musculoskeletal: No worrisome lytic or sclerotic lesions. IMPRESSION: 1. Large left lower lobe mass crosses the left major fissure and is highly worrisome for primary bronchogenic carcinoma. Tiny left pleural effusion with suspected pleural thickening, worrisome but not definitive for malignant involvement. 2. Trace right pleural effusion. 3. Aortic atherosclerosis (ICD10-170.0). Three-vessel coronary artery calcification. 4.  Emphysema (ICD10-J43.9). Electronically Signed   By: MLorin PicketM.D.   On: 06/11/2016 10:19   Mr Brain Wo Contrast  Result Date: 06/06/2016 CLINICAL DATA:  Vertigo.  High risk for CVA. EXAM: MRI HEAD WITHOUT CONTRAST TECHNIQUE: Multiplanar, multiecho pulse sequences of the brain and surrounding structures were obtained without intravenous contrast. COMPARISON:  Brain MRI 05/31/2008.  Head CT from earlier today FINDINGS: Brain: No acute infarction, hemorrhage, hydrocephalus, extra-axial  collection or mass lesion. Remote left MCA territory infarct affecting the insula and inferior frontal lobe. Deep white matter tracts were affected and there is wallerian degeneration seen in the cortical spinal tracts on the left. There is chronic sulcal widening at the vertex in the biparietal region with small areas of cortically based gliosis likely from previous infarcts. Chronic microvascular disease with confluent ischemic gliosis in the cerebral white matter. Prominent atrophy of the bilateral cerebellum, also seen in 2009. Hemosiderin staining along the remote left MCA territory infarct. No generalized chronic hemorrhagic foci. Vascular: Preserved flow voids. Skull and upper cervical spine: Negative Sinuses/Orbits: Negative. No mastoid or middle ear fluid. Grossly symmetric labyrinthine signal. IMPRESSION: 1. No acute finding, including infarct. 2. Extensive chronic microvascular disease and remote left MCA branch infarct. 3. Cerebellar and biparietal predominant atrophy. Electronically Signed   By: Monte Fantasia M.D.   On: 06/06/2016 14:26   Ct Abdomen Pelvis W Contrast  Result Date: 06/10/2016 CLINICAL DATA:  71 year old male with history of weakness and vomiting. Dizziness. EXAM: CT ABDOMEN AND PELVIS WITH CONTRAST TECHNIQUE: Multidetector CT imaging of the abdomen and pelvis was performed using the standard protocol following bolus administration of intravenous contrast. CONTRAST:  68m ISOVUE-300 IOPAMIDOL (ISOVUE-300) INJECTION 61% COMPARISON:  CT the abdomen pelvis 04/24/2004. FINDINGS: Lower chest: Large mass centered in the left lower lobe incompletely visualized but measuring at least 9.2 x 7.3 cm (image 1 of series 6) highly concerning for primary bronchogenic carcinoma. Trace left pleural effusion, likely malignant. Atherosclerotic calcifications in the left circumflex and right coronary arteries. Hepatobiliary: A few tiny subcentimeter low-attenuation lesions are noted in the liver, too  small to definitively characterize. No large suspicious hepatic lesions are noted. No intra or extrahepatic biliary ductal dilatation. Gallbladder is unremarkable in appearance. Pancreas: No pancreatic mass. No pancreatic ductal dilatation. No pancreatic or peripancreatic fluid or inflammatory changes. Spleen: Calcified granulomas in the spleen. Adrenals/Urinary Tract: 13 mm low-attenuation lesion in the lower pole of the left kidney is compatible with a simple cyst. Right kidney and bilateral adrenal glands are normal in appearance. No hydroureteronephrosis. Urinary bladder is normal in appearance. Stomach/Bowel: The appearance of the stomach is normal. There is no pathologic dilatation of small bowel or colon. Long segment thickening of the colonic wall involving predominantly the descending colon, concerning for colitis. Vascular/Lymphatic: Aortic atherosclerosis, without evidence of aneurysm or dissection. 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 No lymphadenopathy noted in the abdomen or pelvis. Reproductive: Prostate gland and seminal vesicles are unremarkable in appearance. Other: No significant volume of ascites.  No pneumoperitoneum. Musculoskeletal: There are no aggressive appearing lytic or blastic lesions noted in the visualized portions of the skeleton. IMPRESSION: 1. Large incompletely visualized mass centered in the left lower lobe highly concerning for primary bronchogenic neoplasm. Trace left pleural effusion is likely malignant, suggesting M1a disease. Further evaluation with contrast enhanced chest CT is recommended at this time to better evaluate the full extent of thoracic disease. 2. No definite evidence of extra thoracic metastatic disease in the abdomen or pelvis. 3. There are tiny subcentimeter low-attenuation lesions in the liver which are too  small to definitively characterize. Metastatic lesions are not entirely excluded, but are not strongly favored. 4. Long segment colonic wall thickening of the descending colon, concerning for colitis. 5. Aortic atherosclerosis, in addition to at least 2 vessel coronary artery disease. Assessment for  potential risk factor modification, dietary therapy or pharmacologic therapy may be warranted, if clinically indicated. 6. Additional incidental findings, as above. Electronically Signed   By: Vinnie Langton M.D.   On: 06/10/2016 15:47  [2 weeks]   Assessment: 71 year old male admitted with sepsis, mental status changes, findings of long-segment colonic wall thickening of descending colon. Incidental finding of large lung mass, which is under evaluation by Dr. Luan Pulling.  Tolerating diet, no diarrhea, and leukocytosis resolved. Question infectious vs ischemic. He has notable atherosclerotic disease on CT. No prior colonoscopy, which could be pursued as outpatient. Continue empiric antibiotics, transitioning to oral in next 24 hours.   Anemia: no overt GI bleeding. Multifactorial.   Plan: 1. Consider transitioning to oral antibiotics and complete total of 5-7 day course.  2. Consider outpatient colonoscopy if patient desires. Further work up of lung mass currently planned which should first priority.  3. Will follow peripherally.   Laureen Ochs. Bernarda Caffey [facility] [phone] 1/10/20189:24 AM     LOS: 3 days

## 2016-06-14 LAB — GLUCOSE, CAPILLARY
GLUCOSE-CAPILLARY: 161 mg/dL — AB (ref 65–99)
GLUCOSE-CAPILLARY: 194 mg/dL — AB (ref 65–99)
Glucose-Capillary: 85 mg/dL (ref 65–99)
Glucose-Capillary: 139 mg/dL — ABNORMAL HIGH (ref 65–99)

## 2016-06-14 LAB — CBC WITH DIFFERENTIAL/PLATELET
BASOS PCT: 0 %
Basophils Absolute: 0 10*3/uL (ref 0.0–0.1)
EOS ABS: 0.2 10*3/uL (ref 0.0–0.7)
Eosinophils Relative: 2 %
HCT: 31.6 % — ABNORMAL LOW (ref 39.0–52.0)
HEMOGLOBIN: 10.3 g/dL — AB (ref 13.0–17.0)
Lymphocytes Relative: 8 %
Lymphs Abs: 0.9 10*3/uL (ref 0.7–4.0)
MCH: 26.7 pg (ref 26.0–34.0)
MCHC: 32.6 g/dL (ref 30.0–36.0)
MCV: 81.9 fL (ref 78.0–100.0)
MONOS PCT: 10 %
Monocytes Absolute: 1 10*3/uL (ref 0.1–1.0)
NEUTROS PCT: 80 %
Neutro Abs: 8.5 10*3/uL — ABNORMAL HIGH (ref 1.7–7.7)
PLATELETS: 277 10*3/uL (ref 150–400)
RBC: 3.86 MIL/uL — ABNORMAL LOW (ref 4.22–5.81)
RDW: 17.7 % — ABNORMAL HIGH (ref 11.5–15.5)
WBC: 10.5 10*3/uL (ref 4.0–10.5)

## 2016-06-14 LAB — BASIC METABOLIC PANEL
Anion gap: 4 — ABNORMAL LOW (ref 5–15)
BUN: 10 mg/dL (ref 6–20)
CALCIUM: 8.6 mg/dL — AB (ref 8.9–10.3)
CO2: 28 mmol/L (ref 22–32)
CREATININE: 0.87 mg/dL (ref 0.61–1.24)
Chloride: 103 mmol/L (ref 101–111)
GFR calc non Af Amer: >60 mL/min (ref >60)
Glucose, Bld: 87 mg/dL (ref 65–99)
Potassium: 3.6 mmol/L (ref 3.5–5.1)
SODIUM: 135 mmol/L (ref 135–145)

## 2016-06-14 LAB — PROTIME-INR
INR: 1.43
PROTHROMBIN TIME: 17.6 s — AB (ref 11.4–15.2)

## 2016-06-14 NOTE — Progress Notes (Signed)
Patient off of Coumadin since admission Lovenox started yesterday for DVT prophylaxis tests in 3.6 today with oral supplementation patient continues on Levaquin as well as Flagyl Frederick [name] EOF:[id number] DOB: 02-25-1946 DOA: 06/10/2016 PCP: Frederick Rudd, MD   Physical Exam: Blood pressure 92/65, pulse 80, temperature 98.9 F (37.2 C), temperature source Oral, resp. rate 13, height '5\' 10"'$  (1.778 m), weight 56.6 kg (124 lb 12.5 oz), SpO2 92 %. Lungs show diminished breath sounds in the bases no rales wheeze rhonchi appreciable mildly prolonged expiratory phase heart regular rhythm no S3-S4 no heaves feels rubs abdomen soft nontender bowel sounds normoactive   Investigations:  Recent Results (from the past 240 hour(s))  Blood culture (routine x 2)     Status: None (Preliminary result)   Collection Time: 06/10/16  2:16 PM  Result Value Ref Range Status   Specimen Description BLOOD LEFT ANTECUBITAL  Final   Special Requests BOTTLES DRAWN AEROBIC AND ANAEROBIC 8CC  Final   Culture NO GROWTH 4 DAYS  Final   Report Status PENDING  Incomplete  Blood culture (routine x 2)     Status: None (Preliminary result)   Collection Time: 06/10/16  2:24 PM  Result Value Ref Range Status   Specimen Description BLOOD  Final   Special Requests NONE  Final   Culture NO GROWTH 4 DAYS  Final   Report Status PENDING  Incomplete  MRSA PCR Screening     Status: None   Collection Time: 06/10/16  5:17 PM  Result Value Ref Range Status   MRSA by PCR NEGATIVE NEGATIVE Final    Comment:        The GeneXpert MRSA Assay (FDA approved for NASAL specimens only), is one component of a comprehensive MRSA colonization surveillance program. It is not intended to diagnose MRSA infection nor to guide or monitor treatment for MRSA infections.      Basic Metabolic Panel:  Recent Labs  06/13/16 0418 06/14/16 0411  NA 136 135  K 3.1* 3.6  CL 103 103  CO2 26 28  GLUCOSE 94 87  BUN 11 10  CREATININE 0.91 0.87   CALCIUM 8.4* 8.6*   Liver Function Tests: No results for input(s): AST, ALT, ALKPHOS, BILITOT, PROT, ALBUMIN in the last 72 hours.   CBC:  Recent Labs  06/13/16 0418 06/14/16 0411  WBC 7.8 10.5  NEUTROABS 6.2 8.5*  HGB 9.1* 10.3*  HCT 29.3* 31.6*  MCV 82.5 81.9  PLT 207 277    Dg Shoulder Right Port  Result Date: 06/13/2016 CLINICAL DATA:  Recurrent falls for the past month. 30 pound weight loss with loss of appetite. Right shoulder pain. EXAM: PORTABLE RIGHT SHOULDER COMPARISON:  None. FINDINGS: Old fracture deformity of the right clavicle. No evidence of acute fracture or dislocation in the right shoulder. No focal bone lesion or bone destruction. Bone cortex appears intact. Soft tissues are unremarkable. IMPRESSION: No acute bony abnormalities. Electronically Signed   By: Frederick [name] M.D.   On: 06/13/2016 21:20      Medications:   Impression:  Principal Problem:   Sepsis ([place]) Active Problems:   Carotid artery occlusion   Colitis   Mass of left lung   AKI (acute kidney injury) ([place])   Acute encephalopathy   Hypothermia     Plan: Monitor electrolytes in a.m. continue Lovenox for DVT prophylaxis for lung biopsy presumably Monday  Consultants: Pulmonary and GI   Procedures   Antibiotics: Levaquin and Flagyl  Time spent: 30 minutes   LOS: 4 days   Frederick [name] M   06/14/2016, 12:48 PM

## 2016-06-14 NOTE — Progress Notes (Signed)
Subjective: He says he feels okay. Physical therapy has recommended skilled care facility placement. I will plan to do bronchoscopy as an outpatient probably next week. I have called to schedule but do not have a time yet  Objective: Vital signs in last 24 hours: Temp:  [97.6 F (36.4 C)-98.7 F (37.1 C)] 98.7 F (37.1 C) (01/11 0400) Pulse Rate:  [78-83] 81 (01/11 0100) Resp:  [13-23] 14 (01/11 0100) BP: (92-100)/(60-66) 100/60 (01/11 0000) SpO2:  [91 %-96 %] 91 % (01/11 0100) Weight:  [56.6 kg (124 lb 12.5 oz)] 56.6 kg (124 lb 12.5 oz) (01/11 0500) Weight change: 3.1 kg (6 lb 13.4 oz) Last BM Date: 06/13/16  Intake/Output from previous day: 01/10 0701 - 01/11 0700 In: 590 [P.O.:240; IV Piggyback:350] Out: 100 [Urine:100]  PHYSICAL EXAM General appearance: alert, cooperative and mild distress Resp: rhonchi bilaterally Cardio: regular rate and rhythm, S1, S2 normal, no murmur, click, rub or gallop GI: soft, non-tender; bowel sounds normal; no masses,  no organomegaly Extremities: extremities normal, atraumatic, no cyanosis or edema Skin warm and dry. Mucous membranes are moist  Lab Results:  Results for orders placed or performed during the hospital encounter of 06/10/16 (from the past 48 hour(s))  Glucose, capillary     Status: None   Collection Time: 06/12/16  7:41 AM  Result Value Ref Range   Glucose-Capillary 86 65 - 99 mg/dL   Comment 1 Notify RN    Comment 2 Document in Chart   Glucose, capillary     Status: Abnormal   Collection Time: 06/12/16 11:13 AM  Result Value Ref Range   Glucose-Capillary 181 (H) 65 - 99 mg/dL   Comment 1 Notify RN    Comment 2 Document in Chart   RPR     Status: None   Collection Time: 06/12/16 12:35 PM  Result Value Ref Range   RPR Ser Ql Non Reactive Non Reactive    Comment: (NOTE) Performed At: [facility] [place], [place] [id number] Lindon Romp MD [id number]   Sedimentation rate     Status:  Abnormal   Collection Time: 06/12/16 12:35 PM  Result Value Ref Range   Sed Rate 55 (H) 0 - 16 mm/hr  CBC     Status: Abnormal   Collection Time: 06/12/16 12:35 PM  Result Value Ref Range   WBC 8.4 4.0 - 10.5 K/uL   RBC 3.53 (L) 4.22 - 5.81 MIL/uL   Hemoglobin 9.2 (L) 13.0 - 17.0 g/dL   HCT 28.9 (L) 39.0 - 52.0 %   MCV 81.9 78.0 - 100.0 fL   MCH 26.1 26.0 - 34.0 pg   MCHC 31.8 30.0 - 36.0 g/dL   RDW 17.2 (H) 11.5 - 15.5 %   Platelets 204 150 - 400 K/uL  HIV antibody (routine testing) (NOT for [facility])     Status: None   Collection Time: 06/12/16 12:35 PM  Result Value Ref Range   HIV Screen 4th Generation wRfx Non Reactive Non Reactive    Comment: (NOTE) Performed At: [facility] [address] [place], [place] [id number] Lindon Romp MD [id number]   Vitamin B12     Status: None   Collection Time: 06/12/16 12:43 PM  Result Value Ref Range   Vitamin B-12 268 180 - 914 pg/mL    Comment: (NOTE) This assay is not validated for testing neonatal or myeloproliferative syndrome specimens for Vitamin B12 levels. Performed at [facility]   TSH  Status: None   Collection Time: 06/12/16 12:43 PM  Result Value Ref Range   TSH 1.686 0.350 - 4.500 uIU/mL    Comment: Performed by a 3rd Generation assay with a functional sensitivity of <=0.01 uIU/mL.  Folate     Status: Abnormal   Collection Time: 06/12/16 12:43 PM  Result Value Ref Range   Folate 5.4 (L) >5.9 ng/mL    Comment: Performed at [facility]  Glucose, capillary     Status: None   Collection Time: 06/12/16  3:36 PM  Result Value Ref Range   Glucose-Capillary 86 65 - 99 mg/dL   Comment 1 Notify RN    Comment 2 Document in Chart   Glucose, capillary     Status: Abnormal   Collection Time: 06/12/16  9:57 PM  Result Value Ref Range   Glucose-Capillary 130 (H) 65 - 99 mg/dL  Basic metabolic panel     Status: Abnormal   Collection Time: 06/13/16  4:18 AM  Result Value Ref Range   Sodium 136  135 - 145 mmol/L   Potassium 3.1 (L) 3.5 - 5.1 mmol/L   Chloride 103 101 - 111 mmol/L   CO2 26 22 - 32 mmol/L   Glucose, Bld 94 65 - 99 mg/dL   BUN 11 6 - 20 mg/dL   Creatinine, Ser 0.91 0.61 - 1.24 mg/dL   Calcium 8.4 (L) 8.9 - 10.3 mg/dL   GFR calc non Af Amer >60 >60 mL/min   GFR calc Af Amer >60 >60 mL/min    Comment: (NOTE) The eGFR has been calculated using the CKD EPI equation. This calculation has not been validated in all clinical situations. eGFR's persistently <60 mL/min signify possible Chronic Kidney Disease.    Anion gap 7 5 - 15  CBC with Differential/Platelet     Status: Abnormal   Collection Time: 06/13/16  4:18 AM  Result Value Ref Range   WBC 7.8 4.0 - 10.5 K/uL   RBC 3.55 (L) 4.22 - 5.81 MIL/uL   Hemoglobin 9.1 (L) 13.0 - 17.0 g/dL   HCT 29.3 (L) 39.0 - 52.0 %   MCV 82.5 78.0 - 100.0 fL   MCH 25.6 (L) 26.0 - 34.0 pg   MCHC 31.1 30.0 - 36.0 g/dL   RDW 17.1 (H) 11.5 - 15.5 %   Platelets 207 150 - 400 K/uL   Neutrophils Relative % 79 %   Neutro Abs 6.2 1.7 - 7.7 K/uL   Lymphocytes Relative 7 %   Lymphs Abs 0.6 (L) 0.7 - 4.0 K/uL   Monocytes Relative 12 %   Monocytes Absolute 0.9 0.1 - 1.0 K/uL   Eosinophils Relative 2 %   Eosinophils Absolute 0.1 0.0 - 0.7 K/uL   Basophils Relative 0 %   Basophils Absolute 0.0 0.0 - 0.1 K/uL  Protime-INR     Status: Abnormal   Collection Time: 06/13/16  4:18 AM  Result Value Ref Range   Prothrombin Time 20.7 (H) 11.4 - 15.2 seconds   INR 1.75   Glucose, capillary     Status: None   Collection Time: 06/13/16  7:41 AM  Result Value Ref Range   Glucose-Capillary 92 65 - 99 mg/dL  Glucose, capillary     Status: Abnormal   Collection Time: 06/13/16 11:31 AM  Result Value Ref Range   Glucose-Capillary 104 (H) 65 - 99 mg/dL   Comment 1 Notify RN   Glucose, capillary     Status: Abnormal   Collection Time: 06/13/16  4:21 PM  Result Value Ref Range   Glucose-Capillary 114 (H) 65 - 99 mg/dL   Comment 1 Notify RN    Glucose, capillary     Status: Abnormal   Collection Time: 06/13/16  8:59 PM  Result Value Ref Range   Glucose-Capillary 120 (H) 65 - 99 mg/dL   Comment 1 Notify RN    Comment 2 Document in Chart   Basic metabolic panel     Status: Abnormal   Collection Time: 06/14/16  4:11 AM  Result Value Ref Range   Sodium 135 135 - 145 mmol/L   Potassium 3.6 3.5 - 5.1 mmol/L   Chloride 103 101 - 111 mmol/L   CO2 28 22 - 32 mmol/L   Glucose, Bld 87 65 - 99 mg/dL   BUN 10 6 - 20 mg/dL   Creatinine, Ser 0.87 0.61 - 1.24 mg/dL   Calcium 8.6 (L) 8.9 - 10.3 mg/dL   GFR calc non Af Amer >60 >60 mL/min   GFR calc Af Amer >60 >60 mL/min    Comment: (NOTE) The eGFR has been calculated using the CKD EPI equation. This calculation has not been validated in all clinical situations. eGFR's persistently <60 mL/min signify possible Chronic Kidney Disease.    Anion gap 4 (L) 5 - 15  CBC with Differential/Platelet     Status: Abnormal   Collection Time: 06/14/16  4:11 AM  Result Value Ref Range   WBC 10.5 4.0 - 10.5 K/uL   RBC 3.86 (L) 4.22 - 5.81 MIL/uL   Hemoglobin 10.3 (L) 13.0 - 17.0 g/dL   HCT 31.6 (L) 39.0 - 52.0 %   MCV 81.9 78.0 - 100.0 fL   MCH 26.7 26.0 - 34.0 pg   MCHC 32.6 30.0 - 36.0 g/dL   RDW 17.7 (H) 11.5 - 15.5 %   Platelets 277 150 - 400 K/uL   Neutrophils Relative % 80 %   Neutro Abs 8.5 (H) 1.7 - 7.7 K/uL   Lymphocytes Relative 8 %   Lymphs Abs 0.9 0.7 - 4.0 K/uL   Monocytes Relative 10 %   Monocytes Absolute 1.0 0.1 - 1.0 K/uL   Eosinophils Relative 2 %   Eosinophils Absolute 0.2 0.0 - 0.7 K/uL   Basophils Relative 0 %   Basophils Absolute 0.0 0.0 - 0.1 K/uL  Protime-INR     Status: Abnormal   Collection Time: 06/14/16  4:11 AM  Result Value Ref Range   Prothrombin Time 17.6 (H) 11.4 - 15.2 seconds   INR 1.43     ABGS No results for input(s): PHART, PO2ART, TCO2, HCO3 in the last 72 hours.  Invalid input(s): PCO2 CULTURES Recent Results (from the past 240 hour(s))   Blood culture (routine x 2)     Status: None (Preliminary result)   Collection Time: 06/10/16  2:16 PM  Result Value Ref Range Status   Specimen Description BLOOD LEFT ANTECUBITAL  Final   Special Requests BOTTLES DRAWN AEROBIC AND ANAEROBIC 8CC  Final   Culture NO GROWTH 3 DAYS  Final   Report Status PENDING  Incomplete  Blood culture (routine x 2)     Status: None (Preliminary result)   Collection Time: 06/10/16  2:24 PM  Result Value Ref Range Status   Specimen Description BLOOD  Final   Special Requests NONE  Final   Culture NO GROWTH 3 DAYS  Final   Report Status PENDING  Incomplete  MRSA PCR Screening     Status: None  Collection Time: 06/10/16  5:17 PM  Result Value Ref Range Status   MRSA by PCR NEGATIVE NEGATIVE Final    Comment:        The GeneXpert MRSA Assay (FDA approved for NASAL specimens only), is one component of a comprehensive MRSA colonization surveillance program. It is not intended to diagnose MRSA infection nor to guide or monitor treatment for MRSA infections.    Studies/Results: Dg Shoulder Right Port  Result Date: 06/13/2016 CLINICAL DATA:  Recurrent falls for the past month. 30 pound weight loss with loss of appetite. Right shoulder pain. EXAM: PORTABLE RIGHT SHOULDER COMPARISON:  None. FINDINGS: Old fracture deformity of the right clavicle. No evidence of acute fracture or dislocation in the right shoulder. No focal bone lesion or bone destruction. Bone cortex appears intact. Soft tissues are unremarkable. IMPRESSION: No acute bony abnormalities. Electronically Signed   By: Lucienne Capers M.D.   On: 06/13/2016 21:20    Medications:  Prior to Admission:  Prescriptions Prior to Admission  Medication Sig Dispense Refill Last Dose  . amLODipine (NORVASC) 10 MG tablet Take 10 mg by mouth daily.     Past Week at Unknown time  . enalapril (VASOTEC) 5 MG tablet Take 5 mg by mouth daily.     Past Week at Unknown time  . ferrous sulfate 325 (65 FE) MG  tablet Take 1 tablet by mouth daily.  5 Past Week at Unknown time  . LORazepam (ATIVAN) 1 MG tablet Take 1 tablet (1 mg total) by mouth 3 (three) times daily as needed for anxiety (or restlessness). 15 tablet 0 Past Week at Unknown time  . meclizine (ANTIVERT) 25 MG tablet Take 1 tablet (25 mg total) by mouth 3 (three) times daily as needed for dizziness. 15 tablet 0 Past Week at Unknown time  . pravastatin (PRAVACHOL) 40 MG tablet Take 40 mg by mouth daily.     Past Week at Unknown time  . buPROPion (ZYBAN) 150 MG 12 hr tablet One tab po qam for 3 days, then increase to one tab BID for 7 weeks (Patient taking differently: Take 150 mg by mouth 2 (two) times daily. One tab po qam for 3 days, then increase to one tab BID for 7 weeks) 30 tablet 0 Past Week at Unknown time  . COUMADIN 2.5 MG tablet Take 1 tablet by mouth daily.     Marland Kitchen warfarin (COUMADIN) 1 MG tablet Take 1 tablet by mouth daily.   Past Week at Unknown time   Scheduled: . enoxaparin (LOVENOX) injection  40 mg Subcutaneous Q24H  . feeding supplement (ENSURE ENLIVE)  237 mL Oral BID BM  . Influenza vac split quadrivalent PF  0.5 mL Intramuscular Tomorrow-1000  . insulin aspart  0-9 Units Subcutaneous TID WC  . levofloxacin (LEVAQUIN) IV  750 mg Intravenous Q24H  . metroNIDAZOLE  500 mg Intravenous Q8H  . multivitamin with minerals  1 tablet Oral Daily  . pneumococcal 23 valent vaccine  0.5 mL Intramuscular Tomorrow-1000  . potassium chloride  20 mEq Oral BID   Continuous:  [id number] **OR** acetaminophen, bisacodyl, ipratropium-albuterol, ondansetron **OR** ondansetron (ZOFRAN) IV, oxyCODONE, polyethylene glycol  Assesment: He was admitted with sepsis presumably from colitis. He had acute kidney injury and acute encephalopathy. All of this is better. He has a large mass in his left lung that is presumably lung cancer. Bronchoscopy with biopsy is planned for next week. He does not want to undergo that now. It has been  recommended that he  go to skilled care facility for rehabilitation. Principal Problem:   Sepsis ([place]) Active Problems:   Carotid artery occlusion   Colitis   Mass of left lung   AKI (acute kidney injury) ([place])   Acute encephalopathy   Hypothermia    Plan: I will follow more peripherally. Schedule bronchoscopy.  Thanks for allowing me to see him with you    LOS: 4 days   Hakeem Frazzini L 06/14/2016, 7:12 AM

## 2016-06-14 NOTE — NC FL2 (Signed)
[place] MEDICAID FL2 LEVEL OF CARE SCREENING TOOL     IDENTIFICATION  Patient Name: Frederick [name] Birthdate: Sep 03, 1945 Sex: male Admission Date (Current Location): 06/10/2016  [facility] and [place] Number:  [employer] and Address:  [facility] [address], [place]      Provider Number: [id number]  Attending Physician Name and Address:  Lucia Gaskins, MD  Relative Name and Phone Number:       Current Level of Care: Hospital Recommended Level of Care: [place] Prior Approval Number:    Date Approved/Denied:   PASRR Number: [id number] ([id number])  Discharge Plan: SNF    Current Diagnoses: Patient Active Problem List   Diagnosis Date Noted  . Colitis 06/10/2016  . Mass of left lung 06/10/2016  . Sepsis ([place]) 06/10/2016  . AKI (acute kidney injury) ([place]) 06/10/2016  . Acute encephalopathy 06/10/2016  . Hypothermia   . Carotid artery occlusion 12/11/2011    Orientation RESPIRATION BLADDER Height & Weight     Self, Time, Situation, Place  Normal Continent Weight: 124 lb 12.5 oz (56.6 kg) Height:  '5\' 10"'$  (177.8 cm)  BEHAVIORAL SYMPTOMS/MOOD NEUROLOGICAL BOWEL NUTRITION STATUS      Continent Diet  AMBULATORY STATUS COMMUNICATION OF NEEDS Skin   Limited Assist Verbally Normal                       Personal Care Assistance Level of Assistance  Bathing, Feeding, Dressing Bathing Assistance: Limited assistance Feeding assistance: Independent Dressing Assistance: Limited assistance     Functional Limitations Info  Sight, Hearing, Speech Sight Info: Adequate Hearing Info: Adequate Speech Info: Adequate    SPECIAL CARE FACTORS FREQUENCY  PT (By licensed PT)     PT Frequency: 5x/week              Contractures Contractures Info: Not present    Additional Factors Info  Code Status, Allergies, Psychotropic Code Status Info: Full Allergies Info: Penicillins Psychotropic Info: Ativan         Current Medications (06/14/2016):  This is the current hospital active medication list Current Facility-Administered Medications  Medication Dose Route Frequency Provider Last Rate Last Dose  . acetaminophen (TYLENOL) tablet 650 mg  650 mg Oral Q6H PRN Vianne Bulls, MD   650 mg at 06/13/16 1401   Or  . acetaminophen (TYLENOL) suppository 650 mg  650 mg Rectal Q6H PRN Vianne Bulls, MD      . bisacodyl (DULCOLAX) suppository 10 mg  10 mg Rectal Daily PRN Vianne Bulls, MD      . enoxaparin (LOVENOX) injection 40 mg  40 mg Subcutaneous Q24H Lucia Gaskins, MD   40 mg at 06/14/16 1038  . feeding supplement (ENSURE ENLIVE) (ENSURE ENLIVE) liquid 237 mL  237 mL Oral BID BM Lucia Gaskins, MD   237 mL at 06/14/16 1038  . Influenza vac split quadrivalent PF (FLUARIX) injection 0.5 mL  0.5 mL Intramuscular Tomorrow-1000 Timothy S Opyd, MD      . insulin aspart (novoLOG) injection 0-9 Units  0-9 Units Subcutaneous TID WC Katherine P Schorr, NP      . ipratropium-albuterol (DUONEB) 0.5-2.5 (3) MG/3ML nebulizer solution 3 mL  3 mL Nebulization Q4H PRN Lucia Gaskins, MD      . levofloxacin (LEVAQUIN) IVPB 750 mg  750 mg Intravenous Q24H Lucia Gaskins, MD   750 mg at 06/14/16 0350  . metroNIDAZOLE (FLAGYL) IVPB 500 mg  500 mg Intravenous  Q8H Vianne Bulls, MD   500 mg at 06/14/16 0617  . multivitamin with minerals tablet 1 tablet  1 tablet Oral Daily Lucia Gaskins, MD   1 tablet at 06/14/16 1037  . ondansetron (ZOFRAN) tablet 4 mg  4 mg Oral Q6H PRN Vianne Bulls, MD       Or  . ondansetron (ZOFRAN) injection 4 mg  4 mg Intravenous Q6H PRN Vianne Bulls, MD      . oxyCODONE (Oxy IR/ROXICODONE) immediate release tablet 5 mg  5 mg Oral Q6H PRN Lucia Gaskins, MD   5 mg at 06/14/16 0617  . pneumococcal 23 valent vaccine (PNU-IMMUNE) injection 0.5 mL  0.5 mL Intramuscular Tomorrow-1000 Timothy S Opyd, MD      . polyethylene glycol (MIRALAX / GLYCOLAX) packet 17 g  17 g Oral Daily PRN  Ilene Qua Opyd, MD      . potassium chloride SA (K-DUR,KLOR-CON) CR tablet 20 mEq  20 mEq Oral BID Lucia Gaskins, MD   20 mEq at 06/14/16 1037     Discharge Medications: Please see discharge summary for a list of discharge medications.  Relevant Imaging Results:  Relevant Lab Results:   Additional Information SSN [id number], Clydene Pugh, LCSW

## 2016-06-14 NOTE — Clinical Social Work Note (Signed)
Clinical Social Work Assessment  Patient Details  Name: Frederick [name] MRN: [id number] Date of Birth: [birth year]  Date of referral:  06/14/16               Reason for consult:  Discharge Planning                Permission sought to share information with:    Permission granted to share information::     Name::        Agency::     Relationship::     Contact Information:     Housing/Transportation Living arrangements for the past 2 months:  Single Family Home Source of Information:  Patient Patient Interpreter Needed:    Criminal Activity/Legal Involvement Pertinent to Current Situation/Hospitalization:    Significant Relationships:  Siblings Lives with:  Self Do you feel safe going back to the place where you live?  Yes Need for family participation in patient care:  Yes (Comment)  Care giving concerns:  None identified at baseline.    Social Worker assessment / plan:  Patient stated that he lives alone, drives, ambulates unassisted, completes ADLs unassisted. Patient fell 3-4 times over the weekend. He attributes his falls to his vertigo and issues with his medications.  Patient declined SNF.  CSW signing off.   Employment status:  Retired Nurse, adult PT Recommendations:  [place] / Referral to community resources:  [place]  Patient/Family's Response to care:  Patient declines SNF.   Patient/Family's Understanding of and Emotional Response to Diagnosis, Current Treatment, and Prognosis: Patient understands his diagnosis, treatment and prognosis.   Emotional Assessment Appearance:  Appears stated age Attitude/Demeanor/Rapport:   (Cooperative) Affect (typically observed):  Calm Orientation:  Oriented to Self, Oriented to Place, Oriented to  Time, Oriented to Situation Alcohol / Substance use:  Not Applicable Psych involvement (Current and /or in the community):  No (Comment)  Discharge Needs   Concerns to be addressed:  Discharge Planning Concerns Readmission within the last 30 days:  No Current discharge risk:  None Barriers to Discharge:  No Barriers Identified   Ihor Gully, LCSW 06/14/2016, 2:11 PM

## 2016-06-14 NOTE — Progress Notes (Signed)
Ate a piece of lunch chicken with gravy. Made patient sick and was coughing up copious amounts of white mucus and phlegm. Gave patient suction catheter and was able to clear mouth his self after about 1 hour.

## 2016-06-15 LAB — GLUCOSE, CAPILLARY
GLUCOSE-CAPILLARY: 152 mg/dL — AB (ref 65–99)
Glucose-Capillary: 132 mg/dL — ABNORMAL HIGH (ref 65–99)
Glucose-Capillary: 151 mg/dL — ABNORMAL HIGH (ref 65–99)
Glucose-Capillary: 146 mg/dL — ABNORMAL HIGH (ref 65–99)

## 2016-06-15 LAB — PROTIME-INR
INR: 1.21
Prothrombin Time: 15.4 seconds — ABNORMAL HIGH (ref 11.4–15.2)

## 2016-06-15 MED ORDER — LEVOFLOXACIN 750 MG PO TABS
750.0000 mg | ORAL_TABLET | Freq: Every day | ORAL | Status: AC
  Administered 2016-06-16: 750 mg via ORAL
  Filled 2016-06-15: qty 1

## 2016-06-15 MED ORDER — METRONIDAZOLE 500 MG PO TABS
500.0000 mg | ORAL_TABLET | Freq: Three times a day (TID) | ORAL | Status: AC
  Administered 2016-06-15 – 2016-06-16 (×5): 500 mg via ORAL
  Filled 2016-06-15 (×5): qty 1

## 2016-06-15 NOTE — Plan of Care (Signed)
Problem: Pain Managment: Goal: General experience of comfort will improve Outcome: Not Progressing Pt c/o "everthing feels broken." Pain meds given and pt repositioned as needed.   Problem: Nutrition: Goal: Adequate nutrition will be maintained Outcome: Not Progressing Pt has poor appetite.

## 2016-06-15 NOTE — Progress Notes (Signed)
I left a message with the OR schedule her to get him set for bronchoscopy next week but I have not heard back yet. I will post the time and date when I have it. Sounds like he may have some issue with aspiration as well as everything else so I've gone ahead and taken the liberty of ordering speech evaluation

## 2016-06-15 NOTE — Care Management Note (Signed)
Case Management Note  Patient Details  Name: Frederick [name] MRN: [id number] Date of Birth: February 27, 1946    Expected Discharge Date:  06/13/16               Expected Discharge Plan:  Home/Self Care  In-House Referral:  NA  Discharge planning Services  CM Consult  Post Acute Care Choice:  Home Health Choice offered to:  Patient  DME Arranged:    DME Agency:     HH Arranged:  RN, PT [place] Agency:  [facility]  Status of Service:  In process, will continue to follow  If discussed at Long Length of Stay Meetings, dates discussed:    Additional Comments: CM spoke with patient again. He states he is still unsure if he wants to go home or rehab. CM did talk about ordering [place] in case patient goes home and offered choice. If patient goes home he would like to use [facility]. CM will place order but if patient goes home over the weekend, order will need to be faxed to [facility].   Journey [place], Chauncey Reading, RN 06/15/2016, 4:18 PM

## 2016-06-15 NOTE — Progress Notes (Signed)
REVIEWED-NO ADDITIONAL RECOMMENDATIONS-slf  Our service is following the patient peripherally. Labs this morning include INR stable/decreased 1.21. CBC yesterday shows improvement in hgb to 10.3. Spoke with nursing staff, no GIB, abdominal pain, N/V. Patient has no GI complains, per RN. Will continue to follow peripherally and are available if needed further   Thank you for allowing [place] to participate in the care of Frederick Nielsen, DNP, AGNP-C Adult & Gerontological Nurse Practitioner [facility]

## 2016-06-15 NOTE — Progress Notes (Signed)
Patient will be transferred to telemetry today we will continue Levaquin for another day for 7 full days of therapy speech evaluation in process also get physical therapy for strengthening ambulation Frederick [name] AVW:[id number] DOB: [birth year] DOA: 06/10/2016 PCP: Frederick Rudd, MD   Physical Exam: Blood pressure 102/80, pulse 80, temperature 98.4 F (36.9 C), temperature source Oral, resp. rate 12, height '5\' 10"'$  (1.778 m), weight 56.6 kg (124 lb 12.5 oz), SpO2 98 %. Lungs diminished breath sounds in the bases prolonged expiratory phase scattered rhonchi no rales appreciable heart regular rhythm no S3-S4 no heaves thrills rubs abdomen soft nontender bowel sounds normoactive   Investigations:  Recent Results (from the past 240 hour(s))  Blood culture (routine x 2)     Status: None (Preliminary result)   Collection Time: 06/10/16  2:16 PM  Result Value Ref Range Status   Specimen Description BLOOD LEFT ANTECUBITAL  Final   Special Requests BOTTLES DRAWN AEROBIC AND ANAEROBIC 8CC  Final   Culture NO GROWTH 4 DAYS  Final   Report Status PENDING  Incomplete  Blood culture (routine x 2)     Status: None (Preliminary result)   Collection Time: 06/10/16  2:24 PM  Result Value Ref Range Status   Specimen Description BLOOD  Final   Special Requests NONE  Final   Culture NO GROWTH 4 DAYS  Final   Report Status PENDING  Incomplete  MRSA PCR Screening     Status: None   Collection Time: 06/10/16  5:17 PM  Result Value Ref Range Status   MRSA by PCR NEGATIVE NEGATIVE Final    Comment:        The GeneXpert MRSA Assay (FDA approved for NASAL specimens only), is one component of a comprehensive MRSA colonization surveillance program. It is not intended to diagnose MRSA infection nor to guide or monitor treatment for MRSA infections.      Basic Metabolic Panel:  Recent Labs  06/13/16 0418 06/14/16 0411  NA 136 135  K 3.1* 3.6  CL 103 103  CO2 26 28  GLUCOSE 94 87  BUN 11 10   CREATININE 0.91 0.87  CALCIUM 8.4* 8.6*   Liver Function Tests: No results for input(s): AST, ALT, ALKPHOS, BILITOT, PROT, ALBUMIN in the last 72 hours.   CBC:  Recent Labs  06/13/16 0418 06/14/16 0411  WBC 7.8 10.5  NEUTROABS 6.2 8.5*  HGB 9.1* 10.3*  HCT 29.3* 31.6*  MCV 82.5 81.9  PLT 207 277    Dg Shoulder Right Port  Result Date: 06/13/2016 CLINICAL DATA:  Recurrent falls for the past month. 30 pound weight loss with loss of appetite. Right shoulder pain. EXAM: PORTABLE RIGHT SHOULDER COMPARISON:  None. FINDINGS: Old fracture deformity of the right clavicle. No evidence of acute fracture or dislocation in the right shoulder. No focal bone lesion or bone destruction. Bone cortex appears intact. Soft tissues are unremarkable. IMPRESSION: No acute bony abnormalities. Electronically Signed   By: Frederick [name] M.D.   On: 06/13/2016 21:20      Medications:   Impression:  Principal Problem:   Sepsis ([place]) Active Problems:   Carotid artery occlusion   Colitis   Mass of left lung   AKI (acute kidney injury) ([place])   Acute encephalopathy   Hypothermia     Plan: Physical therapy continue Lovenox for DVT prophylaxis strengthening and ambulation   Consultants: Physical therapy GI and pulmonary   Procedures   Antibiotics: Levaquin and Flagyl  Time spent: 30 minutes   LOS: 5 days   Frederick [name] M   06/15/2016, 1:11 PM

## 2016-06-15 NOTE — Evaluation (Signed)
Clinical/Bedside Swallow Evaluation Patient Details  Name: Frederick [name] MRN: [id number] Date of Birth: 11/17/1945  Today's Date: 06/15/2016 Time: SLP Start Time (ACUTE ONLY): 1025 SLP Stop Time (ACUTE ONLY): 1110 SLP Time Calculation (min) (ACUTE ONLY): 23 min  Past Medical History:  Past Medical History:  Diagnosis Date  . Carotid artery occlusion   . Hypercholesterolemia   . Hypertension   . Stroke ([place]) 2005  . Vertigo    Past Surgical History:  Past Surgical History:  Procedure Laterality Date  . CAROTID ENDARTERECTOMY  2005   CVA  . CAROTID ENDARTERECTOMY  07/30/2008   Left CEA   HPI: Frederick [name] is a 71 y.o. male with medical history significant for hypertension and carotid artery occlusion with remote CVA status post CEA, now presenting to the emergency department after being found on the floor and poorly responsive by his brother. History is obtained through the patient's brother who also lives next door to him, and through discussion with the ED personnel and review of the medical record. Patient has been suffering recurrent falls for the past month, has reportedly not been eating much at all or taking his medications over this interval, and has lost 30 pounds. He has complained of abdominal discomfort during this period and has had repeated nausea and vomiting. He was evaluated in the emergency department for this previously and was diagnosed with vertigo. His brother reports that every time he checks on the patient, he finds him on the floor. He checked on him today, found him on the floor and only minimally responsive and he was brought into the ED for evaluation. Thursday 06/15/15 pt the following episode:  Pt Ate a piece of lunch chicken with gravy. Made patient sick and was coughing up copious amounts of white mucus and phlegm. Gave patient suction catheter and was able to clear mouth his self after about 1 hour, Dr. Luan Pulling then ordered BSE due to concerns of aspiration.       Assessment / Plan / Recommendation Clinical Impression   Frederick [name] was alert but agitated throughout BSE today. He complained of pain in R shoulder and arm (8-10 on pain scale) -nsg was made aware. He was agreeable to BSE, recalling incident with chicken yesterday (listed in HPI). He states that he can eat whatever he wants (regular and thin at home) but "it takes me a while." Noted only minimal bottom dentition in oral cavity. He denies pain in oral cavity and pain when swallowing. Airway protection was [facility] and lingual strength and coordination were [facility]. He consume ice chips and thin water with spoon, cup, and straw with good vocal quality, timely swallow and no s/sx of aspiration post swallow.  He then consumed applesauce and graham cracker with applesauce with not s/sx of aspiration, good residue clear and timely swallow. He consumed regular consistency graham cracker with prolonged mastication and difficult bolus manipulation, eventually clearing and swallowing with no s/sx of aspiration. He was on 2 L of oxygen during evaluation, repositioning of canula needed (falling out of nose). He is at mild risk of aspiration d/t lack of dentition- ST recommending dys 3 and thin liquids, with use of safe swallowing and aspiration precautions (pt is in agreement). Educated him about precautions and placed handout above HOB. Followed up with nsg. Re: diet recommendation and precautions - ST will monitor during acute stay .  ST Goal: Pt. Will safely consume current without s/sx of aspiration with use of safe swallowing precautions.  Aspiration Risk  Mild aspiration risk    Diet Recommendation Dysphagia 3 (Mech soft);Thin liquid   Liquid Administration via: Spoon;Cup;Straw Medication Administration: Whole meds with liquid Supervision: Intermittent supervision to cue for compensatory strategies Compensations: Slow rate;Small sips/bites;Multiple dry swallows after each bite/sip Postural Changes: Seated  upright at 90 degrees;Remain upright for at least 30 minutes after po intake    Other  Recommendations Oral Care Recommendations: Oral care BID   Follow up Recommendations None      Frequency and Duration min 1 x/week  1 week       Prognosis Prognosis for Safe Diet Advancement: Good Barriers to Reach Goals: Other (Comment) (lacking dentition )      Swallow Study   General Date of Onset: 06/14/16 Type of Study: Bedside Swallow Evaluation Previous Swallow Assessment: none found in EPIC Diet Prior to this Study: Regular;Thin liquids Temperature Spikes Noted: No Respiratory Status: Nasal cannula;Other (comment) (2L ) History of Recent Intubation: No Behavior/Cognition: Agitated;Alert Oral Cavity Assessment: Within Functional Limits Oral Care Completed by SLP: No Oral Cavity - Dentition: Missing dentition;Other (Comment) (bottom dentition only ) Vision: Functional for self-feeding Self-Feeding Abilities: Needs set up;Other (Comment) (secnodary to pain in arm ) Patient Positioning: Upright in bed Baseline Vocal Quality: Normal Volitional Cough: Strong Volitional Swallow: Able to elicit    Oral/Motor/Sensory Function Overall Oral Motor/Sensory Function: Within functional limits   Ice Chips Ice chips: Within functional limits Presentation: Spoon;Self Fed   Thin Liquid Thin Liquid: Within functional limits Presentation: Cup;Straw;Spoon;Self Fed    Nectar Thick Nectar Thick Liquid: Not tested   Honey Thick Honey Thick Liquid: Not tested   Puree Puree: Within functional limits   Solid   Thank you,   Renato Gails. Megan Salon, MS, CCC-SLP  Speech-Language Pathologist [phone]       Solid: Impaired Presentation: Spoon;Self Fed Oral Phase Impairments: Impaired mastication Oral Phase Functional Implications: Impaired mastication Pharyngeal Phase Impairments: Other (comments) (none)        [name] 06/15/2016,1:06 PM

## 2016-06-16 LAB — GLUCOSE, CAPILLARY
Glucose-Capillary: 124 mg/dL — ABNORMAL HIGH (ref 65–99)
Glucose-Capillary: 125 mg/dL — ABNORMAL HIGH (ref 65–99)
Glucose-Capillary: 189 mg/dL — ABNORMAL HIGH (ref 65–99)
Glucose-Capillary: 79 mg/dL (ref 65–99)

## 2016-06-16 NOTE — Progress Notes (Signed)
Pharmacy Anticoagulation and Antibiotic Note  Frederick [name] is a 71 y.o. male admitted on 06/10/2016 with intra abdominal infection.  Pharmacy has been consulted for Lovenox for VTE prophylaxis. Antibiotic course completed after today. Coumadin on hold, patient is scheduled for bronchoscopy  06/19/2016. PT/INR 1.21(1/12)  Plan: Continue Lovenox '40mg'$  SQ q24hrs F/u renal function, cultures and clinical course  Height: '5\' 10"'$  (177.8 cm) Weight: 130 lb 9.6 oz (59.2 kg) IBW/kg (Calculated) : 73  Temp (24hrs), Avg:98.4 F (36.9 C), Min:98.2 F (36.8 C), Max:98.6 F (37 C)   Recent Labs Lab 06/10/16 1337 06/10/16 1347 06/10/16 1416 06/10/16 2029 06/11/16 0418 06/12/16 0441 06/12/16 1235 06/13/16 0418 06/14/16 0411  WBC 25.0*  --   --   --  13.5* 9.2 8.4 7.8 10.5  CREATININE 1.43*  --   --   --  0.94 0.95  --  0.91 0.87  LATICACIDVEN  --  4.90* 3.3* 1.5  --   --   --   --   --     Estimated Creatinine Clearance: 65.2 mL/min (by C-G formula based on SCr of 0.87 mg/dL).    Allergies  Allergen Reactions  . Penicillins Nausea Only    Has patient had a PCN reaction causing immediate rash, facial/tongue/throat swelling, SOB or lightheadedness with hypotension: unknown Has patient had a PCN reaction causing severe rash involving mucus membranes or skin necrosis: unknown Has patient had a PCN reaction that required hospitalization: unknown Has patient had a PCN reaction occurring within the last 10 years: no If all of the above answers are "NO", then may proceed with Cephalosporin use.    Antimicrobials this admission: flagyl 1/7 >> 1/13 levaquin 1/7 >> 1/13  Thank you for allowing pharmacy to be a part of this patient's care. Isac Sarna, BS Pharm D, [place] Clinical Pharmacist Pager [phone] 06/16/2016 9:03 AM

## 2016-06-16 NOTE — Progress Notes (Signed)
Patient scheduled for bronchoscopy on 116 still not stable on the feet 2 Harris to go home as he lives alone physical therapy consult in place for strengthening and ambulation Flagyl continued Levaquin discontinued KARTIK FERNANDO [id number] DOB: 09/26/1945 DOA: 06/10/2016 PCP: Manus Rudd, MD   Physical Exam: Blood pressure 118/65, pulse 88, temperature 98.2 F (36.8 C), temperature source Oral, resp. rate 16, height '5\' 10"'$  (1.778 m), weight 59.2 kg (130 lb 9.6 oz), SpO2 98 %. Lungs diminished breath sounds in bases no rales wheeze rhonchi appreciable heart regular rhythm no murmurs goes he feels rubs abdomen soft nontender bowel sounds normoactive   Investigations:  Recent Results (from the past 240 hour(s))  Blood culture (routine x 2)     Status: None (Preliminary result)   Collection Time: 06/10/16  2:16 PM  Result Value Ref Range Status   Specimen Description BLOOD LEFT ANTECUBITAL  Final   Special Requests BOTTLES DRAWN AEROBIC AND ANAEROBIC 8CC  Final   Culture NO GROWTH 4 DAYS  Final   Report Status PENDING  Incomplete  Blood culture (routine x 2)     Status: None (Preliminary result)   Collection Time: 06/10/16  2:24 PM  Result Value Ref Range Status   Specimen Description BLOOD  Final   Special Requests NONE  Final   Culture NO GROWTH 4 DAYS  Final   Report Status PENDING  Incomplete  MRSA PCR Screening     Status: None   Collection Time: 06/10/16  5:17 PM  Result Value Ref Range Status   MRSA by PCR NEGATIVE NEGATIVE Final    Comment:        The GeneXpert MRSA Assay (FDA approved for NASAL specimens only), is one component of a comprehensive MRSA colonization surveillance program. It is not intended to diagnose MRSA infection nor to guide or monitor treatment for MRSA infections.      Basic Metabolic Panel:  Recent Labs  06/14/16 0411  NA 135  K 3.6  CL 103  CO2 28  GLUCOSE 87  BUN 10  CREATININE 0.87  CALCIUM 8.6*   Liver Function Tests: No  results for input(s): AST, ALT, ALKPHOS, BILITOT, PROT, ALBUMIN in the last 72 hours.   CBC:  Recent Labs  06/14/16 0411  WBC 10.5  NEUTROABS 8.5*  HGB 10.3*  HCT 31.6*  MCV 81.9  PLT 277    No results found.    Medications:   Impression:  Principal Problem:   Sepsis ([place] ) Active Problems:   Carotid artery occlusion   Colitis   Mass of left lung   AKI (acute kidney injury) ([place])   Acute encephalopathy   Hypothermia     Plan: Continue Flagyl continue physical therapy for strengthening ambulation bronchoscopy on 116  Consultants: Gastroenterology and pulmonology   Procedures   Antibiotics: Flagyl         Time spent: 30 minutes   LOS: 6 days   Shelita Steptoe M   06/16/2016, 12:01 PM

## 2016-06-16 NOTE — Progress Notes (Signed)
He is scheduled for bronchoscopy at 7:30 on Tuesday the 16th. I scheduled it as an outpatient procedure but if he still inpatient it will still be done

## 2016-06-17 LAB — GLUCOSE, CAPILLARY
GLUCOSE-CAPILLARY: 73 mg/dL (ref 65–99)
GLUCOSE-CAPILLARY: 100 mg/dL — AB (ref 65–99)
GLUCOSE-CAPILLARY: 93 mg/dL (ref 65–99)
Glucose-Capillary: 106 mg/dL — ABNORMAL HIGH (ref 65–99)

## 2016-06-17 LAB — CULTURE, BLOOD (ROUTINE X 2)
Culture: NO GROWTH
Culture: NO GROWTH

## 2016-06-17 LAB — BASIC METABOLIC PANEL
Anion gap: 8 (ref 5–15)
BUN: 13 mg/dL (ref 6–20)
CO2: 30 mmol/L (ref 22–32)
CREATININE: 0.9 mg/dL (ref 0.61–1.24)
Calcium: 9.3 mg/dL (ref 8.9–10.3)
Chloride: 94 mmol/L — ABNORMAL LOW (ref 101–111)
GFR calc non Af Amer: >60 mL/min (ref >60)
Glucose, Bld: 91 mg/dL (ref 65–99)
POTASSIUM: 4.2 mmol/L (ref 3.5–5.1)
SODIUM: 132 mmol/L — AB (ref 135–145)

## 2016-06-17 NOTE — Progress Notes (Signed)
Patient off of Coumadin currently on Lovenox tentatively planned for tissue biopsy on [address] Frederick [name] JQZ:[id number] DOB: Mar 08, 1946 DOA: 06/10/2016 PCP: Frederick Rudd, MD   Physical Exam: Blood pressure 106/70, pulse 96, temperature 98.6 F (37 C), temperature source Oral, resp. rate 16, height '5\' 10"'$  (1.778 [name]), weight 56.6 kg (124 lb 12.5 oz), SpO2 95 %. Lungs diminished breath sounds in the bases no role his right appreciable prolonged history phase noted or rales audible heart regular rhythm no S3 or S4 or rubs   Investigations:  Recent Results (from the past 240 hour(s))  Blood culture (routine x 2)     Status: None   Collection Time: 06/10/16  2:16 PM  Result Value Ref Range Status   Specimen Description BLOOD LEFT ANTECUBITAL  Final   Special Requests BOTTLES DRAWN AEROBIC AND ANAEROBIC 8CC  Final   Culture NO GROWTH 7 DAYS  Final   Report Status 06/17/2016 FINAL  Final  Blood culture (routine x 2)     Status: None   Collection Time: 06/10/16  2:24 PM  Result Value Ref Range Status   Specimen Description BLOOD  Final   Special Requests NONE  Final   Culture NO GROWTH 7 DAYS  Final   Report Status 06/17/2016 FINAL  Final  MRSA PCR Screening     Status: None   Collection Time: 06/10/16  5:17 PM  Result Value Ref Range Status   MRSA by PCR NEGATIVE NEGATIVE Final    Comment:        The GeneXpert MRSA Assay (FDA approved for NASAL specimens only), is one component of a comprehensive MRSA colonization surveillance program. It is not intended to diagnose MRSA infection nor to guide or monitor treatment for MRSA infections.      Basic Metabolic Panel:  Recent Labs  06/17/16 0634  NA 132*  K 4.2  CL 94*  CO2 30  GLUCOSE 91  BUN 13  CREATININE 0.90  CALCIUM 9.3   Liver Function Tests: No results for input(s): AST, ALT, ALKPHOS, BILITOT, PROT, ALBUMIN in the last 72 hours.   CBC: No results for input(s): WBC, NEUTROABS, HGB, HCT, MCV, PLT in the last 72  hours.  No results found.    Medications:   Impression:  Principal Problem:   Sepsis ([place]) Active Problems:   Carotid artery occlusion   Colitis   Mass of left lung   AKI (acute kidney injury) ([place])   Acute encephalopathy   Hypothermia     Plan: CBC and debridement Monday morning and 1:15  Consultants:    Procedures tentatively scheduled for biopsy on 116   Antibiotics: Flagyl 500 3 times a day           Time spent: 30 minutes   LOS: 7 days   Frederick [name]   06/17/2016, 12:58 PM

## 2016-06-18 LAB — BASIC METABOLIC PANEL
Anion gap: 6 (ref 5–15)
BUN: 14 mg/dL (ref 6–20)
CALCIUM: 9.3 mg/dL (ref 8.9–10.3)
CHLORIDE: 97 mmol/L — AB (ref 101–111)
CO2: 30 mmol/L (ref 22–32)
CREATININE: 0.88 mg/dL (ref 0.61–1.24)
GFR calc non Af Amer: >60 mL/min (ref >60)
GLUCOSE: 94 mg/dL (ref 65–99)
Potassium: 4.2 mmol/L (ref 3.5–5.1)
Sodium: 133 mmol/L — ABNORMAL LOW (ref 135–145)

## 2016-06-18 LAB — CBC WITH DIFFERENTIAL/PLATELET
BASOS PCT: 0 %
Basophils Absolute: 0 10*3/uL (ref 0.0–0.1)
EOS ABS: 0.1 10*3/uL (ref 0.0–0.7)
EOS PCT: 0 %
HCT: 32.5 % — ABNORMAL LOW (ref 39.0–52.0)
HEMOGLOBIN: 10.6 g/dL — AB (ref 13.0–17.0)
LYMPHS ABS: 0.6 10*3/uL — AB (ref 0.7–4.0)
Lymphocytes Relative: 3 %
MCH: 26.6 pg (ref 26.0–34.0)
MCHC: 32.6 g/dL (ref 30.0–36.0)
MCV: 81.5 fL (ref 78.0–100.0)
MONO ABS: 2.2 10*3/uL — AB (ref 0.1–1.0)
MONOS PCT: 12 %
NEUTROS PCT: 85 %
Neutro Abs: 15.4 10*3/uL — ABNORMAL HIGH (ref 1.7–7.7)
Platelets: 280 10*3/uL (ref 150–400)
RBC: 3.99 MIL/uL — ABNORMAL LOW (ref 4.22–5.81)
RDW: 17.9 % — AB (ref 11.5–15.5)
WBC: 18.3 10*3/uL — ABNORMAL HIGH (ref 4.0–10.5)

## 2016-06-18 LAB — GLUCOSE, CAPILLARY
GLUCOSE-CAPILLARY: 146 mg/dL — AB (ref 65–99)
Glucose-Capillary: 108 mg/dL — ABNORMAL HIGH (ref 65–99)
Glucose-Capillary: 120 mg/dL — ABNORMAL HIGH (ref 65–99)
Glucose-Capillary: 97 mg/dL (ref 65–99)

## 2016-06-18 MED ORDER — CHLORHEXIDINE GLUCONATE CLOTH 2 % EX PADS
6.0000 | MEDICATED_PAD | Freq: Once | CUTANEOUS | Status: AC
  Administered 2016-06-19: 6 via TOPICAL

## 2016-06-18 MED ORDER — CHLORHEXIDINE GLUCONATE CLOTH 2 % EX PADS
6.0000 | MEDICATED_PAD | Freq: Once | CUTANEOUS | Status: AC
  Administered 2016-06-18: 6 via TOPICAL

## 2016-06-18 NOTE — Progress Notes (Signed)
For broncoscopy i n a.m. Hemodynamically stable Frederick [name] [id number] DOB: 1946-04-21 DOA: 06/10/2016 PCP: Manus Rudd, MD   Physical Exam: Blood pressure 97/61, pulse 84, temperature 98.4 F (36.9 C), temperature source Oral, resp. rate 19, height '5\' 10"'$  (1.778 m), weight 54.2 kg (119 lb 7.8 oz), SpO2 90 %.Lungs diminished breath sounds in bases. Heart Reg. Rhythm S3   Investigations:  Recent Results (from the past 240 hour(s))  Blood culture (routine x 2)     Status: None   Collection Time: 06/10/16  2:16 PM  Result Value Ref Range Status   Specimen Description BLOOD LEFT ANTECUBITAL  Final   Special Requests BOTTLES DRAWN AEROBIC AND ANAEROBIC 8CC  Final   Culture NO GROWTH 7 DAYS  Final   Report Status 06/17/2016 FINAL  Final  Blood culture (routine x 2)     Status: None   Collection Time: 06/10/16  2:24 PM  Result Value Ref Range Status   Specimen Description BLOOD  Final   Special Requests NONE  Final   Culture NO GROWTH 7 DAYS  Final   Report Status 06/17/2016 FINAL  Final  MRSA PCR Screening     Status: None   Collection Time: 06/10/16  5:17 PM  Result Value Ref Range Status   MRSA by PCR NEGATIVE NEGATIVE Final    Comment:        The GeneXpert MRSA Assay (FDA approved for NASAL specimens only), is one component of a comprehensive MRSA colonization surveillance program. It is not intended to diagnose MRSA infection nor to guide or monitor treatment for MRSA infections.      Basic Metabolic Panel:  Recent Labs  06/17/16 0634 06/18/16 0453  NA 132* 133*  K 4.2 4.2  CL 94* 97*  CO2 30 30  GLUCOSE 91 94  BUN 13 14  CREATININE 0.90 0.88  CALCIUM 9.3 9.3   Liver Function Tests: No results for input(s): AST, ALT, ALKPHOS, BILITOT, PROT, ALBUMIN in the last 72 hours.   CBC:  Recent Labs  06/18/16 0453  WBC 18.3*  NEUTROABS 15.4*  HGB 10.6*  HCT 32.5*  MCV 81.5  PLT 280    No results found.    Medications:   Impression:  Principal  Problem:   Sepsis ([place]) Active Problems:   Carotid artery occlusion   Colitis   Mass of left lung   AKI (acute kidney injury) ([place])   Acute encephalopathy   Hypothermia     Plan:bronchoscopy in AM    Consultants:     Procedures   Antibiotics:           Time spent:30 min   LOS: 8 days   Hector Taft M   06/18/2016, 12:59 PM

## 2016-06-18 NOTE — Progress Notes (Signed)
Set for bronchoscopy tomorrow

## 2016-06-18 NOTE — Clinical Social Work Note (Signed)
CSW met with patient at bedside to revisit discharge plan. Patient is now agreeable to SNF placement and states that he will accept the bed at [facility]. [facility] has been updated and will be ready for the patient once he is discharged. The patient's main concern has been his dog. The dog is currently being taken care of by the brother.   Liz Beach MSW, [place], [place], [phone]

## 2016-06-19 ENCOUNTER — Encounter (HOSPITAL_COMMUNITY): Payer: Self-pay

## 2016-06-19 ENCOUNTER — Ambulatory Visit (HOSPITAL_COMMUNITY): Admit: 2016-06-19 | Payer: Medicare Other | Admitting: Pulmonary Disease

## 2016-06-19 ENCOUNTER — Encounter (HOSPITAL_COMMUNITY)
Admission: EM | Disposition: A | Discharge status: Skilled Nursing Facility | Payer: Self-pay | Source: Home / Self Care | Attending: Family Medicine

## 2016-06-19 HISTORY — PX: FLEXIBLE BRONCHOSCOPY: SHX5094

## 2016-06-19 LAB — GLUCOSE, CAPILLARY
Glucose-Capillary: 104 mg/dL — ABNORMAL HIGH (ref 65–99)
Glucose-Capillary: 95 mg/dL (ref 65–99)
Glucose-Capillary: 108 mg/dL — ABNORMAL HIGH (ref 65–99)

## 2016-06-19 LAB — BASIC METABOLIC PANEL
ANION GAP: 5 (ref 5–15)
BUN: 15 mg/dL (ref 6–20)
CALCIUM: 9 mg/dL (ref 8.9–10.3)
CO2: 30 mmol/L (ref 22–32)
Chloride: 97 mmol/L — ABNORMAL LOW (ref 101–111)
Creatinine, Ser: 0.91 mg/dL (ref 0.61–1.24)
GFR calc Af Amer: >60 mL/min (ref >60)
GFR calc non Af Amer: >60 mL/min (ref >60)
GLUCOSE: 96 mg/dL (ref 65–99)
Potassium: 4.1 mmol/L (ref 3.5–5.1)
Sodium: 132 mmol/L — ABNORMAL LOW (ref 135–145)

## 2016-06-19 SURGERY — BRONCHOSCOPY, FLEXIBLE
Anesthesia: Moderate Sedation | Laterality: Bilateral

## 2016-06-19 MED ORDER — MIDAZOLAM HCL 5 MG/5ML IJ SOLN
INTRAMUSCULAR | Status: AC
  Filled 2016-06-19: qty 10

## 2016-06-19 MED ORDER — LIDOCAINE HCL (PF) 2 % IJ SOLN
INTRAMUSCULAR | Status: AC
  Filled 2016-06-19: qty 20

## 2016-06-19 MED ORDER — BUTAMBEN-TETRACAINE-BENZOCAINE 2-2-14 % EX AERO
INHALATION_SPRAY | CUTANEOUS | Status: DC | PRN
Start: 2016-06-19 — End: 2016-06-19
  Administered 2016-06-19: 1 via TOPICAL

## 2016-06-19 MED ORDER — MIDAZOLAM HCL 10 MG/2ML IJ SOLN
INTRAMUSCULAR | Status: DC | PRN
  Administered 2016-06-19: 2 mg via INTRAVENOUS
  Administered 2016-06-19: 1 mg via INTRAVENOUS

## 2016-06-19 MED ORDER — LIDOCAINE VISCOUS 2 % MT SOLN
OROMUCOSAL | Status: DC | PRN
  Administered 2016-06-19: 1 via OROMUCOSAL

## 2016-06-19 MED ORDER — LIDOCAINE VISCOUS 2 % MT SOLN
OROMUCOSAL | Status: AC
  Filled 2016-06-19: qty 15

## 2016-06-19 MED ORDER — LIDOCAINE HCL (PF) 2 % IJ SOLN
INTRAMUSCULAR | Status: DC | PRN
  Administered 2016-06-19: 5 mg

## 2016-06-19 SURGICAL SUPPLY — 16 items
BRUSH CYTOL CELLEBRITY 1.5X140 (MISCELLANEOUS) ×3 IMPLANT
CLOTH BEACON ORANGE TIMEOUT ST (SAFETY) ×3 IMPLANT
CONNECTOR 5 IN 1 STRAIGHT STRL (MISCELLANEOUS) ×3 IMPLANT
FORCEPS BIOP RJ4 1.8 (CUTTING FORCEPS) ×3 IMPLANT
GLOVE BIO SURGEON STRL SZ7.5 (GLOVE) ×3 IMPLANT
KIT CLEAN CATCH URINE (SET/KITS/TRAYS/PACK) IMPLANT
MARKER SKIN DUAL TIP RULER LAB (MISCELLANEOUS) ×3 IMPLANT
NS IRRIG 1000ML POUR BTL (IV SOLUTION) ×3 IMPLANT
SPONGE GAUZE 4X4 12PLY (GAUZE/BANDAGES/DRESSINGS) ×3 IMPLANT
SYR 20CC LL (SYRINGE) ×3 IMPLANT
SYR 30ML LL (SYRINGE) ×3 IMPLANT
SYR CONTROL 10ML LL (SYRINGE) ×3 IMPLANT
TRAP SPECIMEN CP (MISCELLANEOUS) ×3 IMPLANT
VALVE DISPOSABLE (MISCELLANEOUS) ×3 IMPLANT
WATER STERILE IRR 1000ML POUR (IV SOLUTION) ×3 IMPLANT
YANKAUER SUCT BULB TIP 10FT TU (MISCELLANEOUS) ×6 IMPLANT

## 2016-06-19 NOTE — H&P (Signed)
This is updated history and physical on Frederick [name]. He is set for fiberoptic bronchoscopy today. He has left lower lobe mass presumably lung cancer. He was admitted with sepsis which has resolved. He had colitis which has resolved. Exam now shows that he's awake and alert calm. Chest shows rhonchi bilaterally. He is okay for planned procedure.  He understands risks and benefits of procedure with shaft been discussed with him at length.

## 2016-06-19 NOTE — Progress Notes (Signed)
Pharmacy Anticoagulation and Antibiotic Note  Frederick [name] is a 71 y.o. male admitted on 06/10/2016 with intra abdominal infection.  Pharmacy has been consulted for Lovenox for VTE prophylaxis. Antibiotic course completed after today. Coumadin on hold due to bronchoscopy  Plan: Continue Lovenox '40mg'$  SQ q24hrs F/u renal function, cultures and clinical course  Height: '5\' 10"'$  (177.8 cm) Weight: 118 lb 9.6 oz (53.8 kg) IBW/kg (Calculated) : 73  Temp (24hrs), Avg:98.1 F (36.7 C), Min:97.6 F (36.4 C), Max:98.6 F (37 C)   Recent Labs Lab 06/12/16 1235 06/13/16 0418 06/14/16 0411 06/17/16 0634 06/18/16 0453 06/19/16 0604  WBC 8.4 7.8 10.5  --  18.3*  --   CREATININE  --  0.91 0.87 0.90 0.88 0.91    Estimated Creatinine Clearance: 56.7 mL/min (by C-G formula based on SCr of 0.91 mg/dL).    Allergies  Allergen Reactions  . Penicillins Nausea Only    Has patient had a PCN reaction causing immediate rash, facial/tongue/throat swelling, SOB or lightheadedness with hypotension: unknown Has patient had a PCN reaction causing severe rash involving mucus membranes or skin necrosis: unknown Has patient had a PCN reaction that required hospitalization: unknown Has patient had a PCN reaction occurring within the last 10 years: no If all of the above answers are "NO", then may proceed with Cephalosporin use.    Antimicrobials this admission: flagyl 1/7 >> 1/13 levaquin 1/7 >> 1/13  Thank you for allowing pharmacy to be a part of this patient's care.  Hart Robinsons, PharmD Clinical Pharmacist Pager:  [phone] 06/19/2016   06/19/2016 8:52 AM

## 2016-06-19 NOTE — Progress Notes (Signed)
Bronchoscopy performed today by Dr. Luan Pulling patient lying comfortable no evidence of desaturation we'll observe until tomorrow for discharge KURON DOCKEN [id number]:[id number] DOB: 23-Apr-1946 DOA: 06/10/2016 PCP: Manus Rudd, MD   Physical Exam: Blood pressure 107/83, pulse (!) 101, temperature 98.1 F (36.7 C), resp. rate (!) 26, height '5\' 10"'$  (1.778 m), weight 53.8 kg (118 lb 9.6 oz), SpO2 94 %. Lungs diminished breath sounds to bases no rales wheeze rhonchi appreciable heart regular rhythm no murmurs gallops heaves thrills rubs   Investigations:  Recent Results (from the past 240 hour(s))  Blood culture (routine x 2)     Status: None   Collection Time: 06/10/16  2:16 PM  Result Value Ref Range Status   Specimen Description BLOOD LEFT ANTECUBITAL  Final   Special Requests BOTTLES DRAWN AEROBIC AND ANAEROBIC 8CC  Final   Culture NO GROWTH 7 DAYS  Final   Report Status 06/17/2016 FINAL  Final  Blood culture (routine x 2)     Status: None   Collection Time: 06/10/16  2:24 PM  Result Value Ref Range Status   Specimen Description BLOOD  Final   Special Requests NONE  Final   Culture NO GROWTH 7 DAYS  Final   Report Status 06/17/2016 FINAL  Final  MRSA PCR Screening     Status: None   Collection Time: 06/10/16  5:17 PM  Result Value Ref Range Status   MRSA by PCR NEGATIVE NEGATIVE Final    Comment:        The GeneXpert MRSA Assay (FDA approved for NASAL specimens only), is one component of a comprehensive MRSA colonization surveillance program. It is not intended to diagnose MRSA infection nor to guide or monitor treatment for MRSA infections.      Basic Metabolic Panel:  Recent Labs  06/18/16 0453 06/19/16 0604  NA 133* 132*  K 4.2 4.1  CL 97* 97*  CO2 30 30  GLUCOSE 94 96  BUN 14 15  CREATININE 0.88 0.91  CALCIUM 9.3 9.0   Liver Function Tests: No results for input(s): AST, ALT, ALKPHOS, BILITOT, PROT, ALBUMIN in the last 72 hours.   CBC:  Recent Labs   06/18/16 0453  WBC 18.3*  NEUTROABS 15.4*  HGB 10.6*  HCT 32.5*  MCV 81.5  PLT 280    No results found.    Medications:   Impression: Principal Problem:   Sepsis ([place]) Active Problems:   Carotid artery occlusion   Colitis   Mass of left lung   AKI (acute kidney injury) ([place])   Acute encephalopathy   Hypothermia     Plan:Consider discharge in 24 hours  Consultants: Pulmonology gastroenterology   Procedures   Antibiotics:           Time spent: 30 minutes   LOS: 9 days   Wilba Mutz M   06/19/2016, 12:44 PM

## 2016-06-19 NOTE — Op Note (Signed)
Bronchoscopy Procedure Note Frederick [name] [id number] 04-30-1946  Procedure: Bronchoscopy Indications: Diagnostic evaluation of the airways, Obtain specimens for culture and/or other diagnostic studies and Biopsy lung mass  Procedure Details Consent: Risks of procedure as well as the alternatives and risks of each were explained to the (patient/caregiver).  Consent for procedure obtained. Time Out: Verified patient identification, verified procedure, site/side was marked, verified correct patient position, special equipment/implants available, medications/allergies/relevent history reviewed, required imaging and test results available.  Performed  In preparation for procedure, bronchoscope lubricated. Sedation: Benzodiazepines  Airway entered and the following bronchi were examined: RUL, RML, RLL, LUL, LLL and Bronchi.  He had significant amount of secretions and had obstructive mass in the left lower lobe Procedures performed: Brushings performed multiple biopsies of lung mass washings of lung mass Bronchoscope removed.    Evaluation Hemodynamic Status: BP stable throughout; O2 sats: transiently fell during during procedure Patient's Current Condition: stable Specimens:  Sent serosanguinous fluid brush, biopsies Complications: No apparent complications Patient did tolerate procedure well.   Dorenda Pfannenstiel L 06/19/2016

## 2016-06-20 ENCOUNTER — Encounter (HOSPITAL_COMMUNITY): Payer: Self-pay | Admitting: Pulmonary Disease

## 2016-06-20 LAB — BASIC METABOLIC PANEL
ANION GAP: 7 (ref 5–15)
BUN: 14 mg/dL (ref 6–20)
CALCIUM: 9.5 mg/dL (ref 8.9–10.3)
CHLORIDE: 96 mmol/L — AB (ref 101–111)
CO2: 30 mmol/L (ref 22–32)
Creatinine, Ser: 0.82 mg/dL (ref 0.61–1.24)
GFR calc non Af Amer: >60 mL/min (ref >60)
GLUCOSE: 99 mg/dL (ref 65–99)
Potassium: 4.5 mmol/L (ref 3.5–5.1)
Sodium: 133 mmol/L — ABNORMAL LOW (ref 135–145)

## 2016-06-20 LAB — GLUCOSE, CAPILLARY: Glucose-Capillary: 92 mg/dL (ref 65–99)

## 2016-06-20 MED ORDER — IPRATROPIUM-ALBUTEROL 0.5-2.5 (3) MG/3ML IN SOLN: 3.0000 mL | Freq: Four times a day (QID) | RESPIRATORY_TRACT | 3 refills | Status: AC | PRN

## 2016-06-20 MED ORDER — POTASSIUM CHLORIDE CRYS ER 20 MEQ PO TBCR: 20.0000 meq | EXTENDED_RELEASE_TABLET | Freq: Two times a day (BID) | ORAL | 1 refills | Status: AC

## 2016-06-20 MED ORDER — LORAZEPAM 1 MG PO TABS: 1.0000 mg | ORAL_TABLET | Freq: Every day | ORAL | 0 refills | Status: AC

## 2016-06-20 MED ORDER — ENSURE ENLIVE PO LIQD: 237.0000 mL | Freq: Two times a day (BID) | ORAL | 12 refills | Status: AC

## 2016-06-20 NOTE — Progress Notes (Signed)
Awaiting biopsy results for presumed oncology consult Frederick [name] LLV:[id number] DOB: 1946-03-29 DOA: 06/10/2016 PCP: Manus Rudd, MD   Physical Exam: Blood pressure 102/73, pulse 92, temperature 97.8 F (36.6 C), temperature source Oral, resp. rate 18, height '5\' 10"'$  (1.778 m), weight 49.9 kg (110 lb), SpO2 100 %. Lungs show prolonged respiratory phase diminished breath sounds in the bases no rales wheeze audible heart regular rhythm no S3 or S4 no heaves thrills rubs   Investigations:  Recent Results (from the past 240 hour(s))  Blood culture (routine x 2)     Status: None   Collection Time: 06/10/16  2:16 PM  Result Value Ref Range Status   Specimen Description BLOOD LEFT ANTECUBITAL  Final   Special Requests BOTTLES DRAWN AEROBIC AND ANAEROBIC 8CC  Final   Culture NO GROWTH 7 DAYS  Final   Report Status 06/17/2016 FINAL  Final  Blood culture (routine x 2)     Status: None   Collection Time: 06/10/16  2:24 PM  Result Value Ref Range Status   Specimen Description BLOOD  Final   Special Requests NONE  Final   Culture NO GROWTH 7 DAYS  Final   Report Status 06/17/2016 FINAL  Final  MRSA PCR Screening     Status: None   Collection Time: 06/10/16  5:17 PM  Result Value Ref Range Status   MRSA by PCR NEGATIVE NEGATIVE Final    Comment:        The GeneXpert MRSA Assay (FDA approved for NASAL specimens only), is one component of a comprehensive MRSA colonization surveillance program. It is not intended to diagnose MRSA infection nor to guide or monitor treatment for MRSA infections.      Basic Metabolic Panel:  Recent Labs  06/19/16 0604 06/20/16 0544  NA 132* 133*  K 4.1 4.5  CL 97* 96*  CO2 30 30  GLUCOSE 96 99  BUN 15 14  CREATININE 0.91 0.82  CALCIUM 9.0 9.5   Liver Function Tests: No results for input(s): AST, ALT, ALKPHOS, BILITOT, PROT, ALBUMIN in the last 72 hours.   CBC:  Recent Labs  06/18/16 0453  WBC 18.3*  NEUTROABS 15.4*  HGB 10.6*  HCT  32.5*  MCV 81.5  PLT 280    No results found.    Medicatio  Impression:  Principal Problem:   Sepsis ([place]) Active Problems:   Carotid artery occlusion   Colitis   Mass of left lung   AKI (acute kidney injury) ([place])   Acute encephalopathy   Hypothermia     Plan: Oncology consult  Consultants: Gastroenterology and pulmonology oncology requested   Procedures status post bronchoscopy   Antibiotics:          Time spent: 30 minutes   LOS: 10 days   Atari Novick M   06/20/2016, 7:59 AM

## 2016-06-20 NOTE — Care Management Important Message (Signed)
Important Message  Patient Details  Name: Frederick [name] MRN: [id number] Date of Birth: 12/03/1945   Medicare Important Message Given:  Yes    Kristilyn Coltrane, Chauncey Reading, RN 06/20/2016, 9:53 AM

## 2016-06-20 NOTE — Clinical Social Work Placement (Addendum)
   CLINICAL SOCIAL WORK PLACEMENT  NOTE  Date:  06/20/2016  Patient Details  Name: Frederick [name] MRN: [id number] Date of Birth: [birth year]  Clinical Social Work is seeking post-discharge placement for this patient at the [place] level of care (*CSW will initial, date and re-position this form in  chart as items are completed):  No   Patient/family provided with [place] Work Department's list of facilities offering this level of care within the geographic area requested by the patient (or if unable, by the patient's family).  Yes   Patient/family informed of their freedom to choose among providers that offer the needed level of care, that participate in Medicare, Medicaid or managed care program needed by the patient, have an available bed and are willing to accept the patient.  Yes   Patient/family informed of [place]'s ownership interest in [facility] and [facility], as well as of the fact that they are under no obligation to receive care at these facilities.  PASRR submitted to EDS on 06/18/16     PASRR number received on 06/18/16     Existing PASRR number confirmed on       FL2 transmitted to all facilities in geographic area requested by pt/family on 06/18/16     FL2 transmitted to all facilities within larger geographic area on       Patient informed that his/her managed care company has contracts with or will negotiate with certain facilities, including the following:        Yes   Patient/family informed of bed offers received.  Patient chooses bed at [place] [facility]     Physician recommends and patient chooses bed at      Patient to be transferred to [facility] [place] on 06/20/16.  Patient to be transferred to facility by [facility] nor [employer] are running today due to inclement weather. CSW has left a message for patient's brother regarding transportation.     Patient family notified on 06/20/16 of  transfer.  Name of family member notified:  Frederick [name], brother, message left requesting return contact.     PHYSICIAN       Additional Comment:  CSW signing off. CSW received return call from Fidela Salisbury, brother, who advised that he would pick patient up and transport to [facility] in about an hour.   _______________________________________________ Ambrose Pancoast D, LCSW 06/20/2016, 10:27 AM

## 2016-06-20 NOTE — Care Management Note (Signed)
Case Management Note  Patient Details  Name: RAJA LISKA MRN: [id number] Date of Birth: 19-Jul-1945    Expected Discharge Date:  06/20/16               Expected Discharge Plan:  Home/Self Care  In-House Referral:  NA  Discharge planning Services  CM Consult  Post Acute Care Choice:  Home Health Choice offered to:  Patient  DME Arranged:    DME Agency:     HH Arranged:  RN, PT [place] Agency:  [place]  Status of Service:  Completed, signed off  If discussed at [place] of Stay Meetings, dates discussed:    Additional Comments: Patient discharging to [facility] today. CSW making arrangements.  Keishawna Carranza, Chauncey Reading, RN 06/20/2016, 9:48 AM

## 2016-06-20 NOTE — Progress Notes (Signed)
PT Cancellation Note  Patient Details Name: Frederick [name] MRN: [id number] DOB: [birth year]   Cancelled Treatment:    Reason Eval/Treat Not Completed: Patient declined, no reason specified (Pt adamantly refused PT.)   Beth Nain Rudd, PT, DPT X: [phone]

## 2016-06-20 NOTE — Care Management Important Message (Signed)
Important Message  Patient Details  Name: DEVONTE MIGUES MRN: [id number] Date of Birth: Dec 24, 1945   Medicare Important Message Given:  Yes    Marisela Line, Chauncey Reading, RN 06/20/2016, 9:57 AM

## 2016-06-20 NOTE — Discharge Summary (Signed)
Physician Discharge Summary  Frederick [name] QJJ:[id number] DOB: 11/26/1945 DOA: 06/10/2016  PCP: Frederick Rudd, MD  Admit date: 06/10/2016 Discharge date: 06/20/2016   Recommendations for Outpatient Follow-up:  The patient is discharged to skilled nursing facility to work on strengthening ambulation with physical therapy he will require an oncology consult for left lower lobe mass which tissue results are not back yet he is status post colitis treated with Flagyl and Levaquin with resolution clinically dry to his hospital admission he was on anticoagulation for carotids significant stenosis this was stopped due to his lung mass and colitis risk-benefit ratio at present was in favor of discontinuing anticoagulation Discharge Diagnoses:  Principal Problem:   Sepsis [facility]) Active Problems:   Carotid artery occlusion   Colitis   Mass of left lung   AKI (acute kidney injury) ([place])   Acute encephalopathy   Hypothermia   Discharge Condition: Stable  Filed Weights   06/18/16 0500 06/19/16 0621 06/20/16 0443  Weight: 54.2 kg (119 lb 7.8 oz) 53.8 kg (118 lb 9.6 oz) 49.9 kg (110 lb)    History of present illness:  The patient is a 71 year old white male who was found multiple times by brother on the floor had some diminished her mental status although was alert and oriented 3 he was admitted with possible septicemia diagnosed with colitis on the basis of CT findings and given dual antibiotics incidental finding was a left lower lobe mass 8 cm which was treated along with C antibiotics for colitis after this improved he was no longer septic to see in consultation by gastroenterology and pulmonology who performed a bronchoscopy 48 hours prior to discharge patient was stable and generalized weakness he was admitted to skilled nursing facility due to his inability to live alone inability to ambulate safely alone and requirements for careful titration of new medicines his chronic anticoagulation first  carotid stenosis was discontinued due to adverse risk-benefit ratio due to lung mass and colitis he will require an oncology consult presumably once tissue biopsy results of back  Hospital Course:    Procedures:   Bronchoscopy with biopsies  Consultations:    Pulmonology gastroenterology  Discharge Instructions  Discharge Instructions    Discharge instructions    Complete by:  As directed    Discharge patient    Complete by:  As directed    Discharge disposition:  [facility]   Discharge patient date:  06/20/2016     Allergies as of 06/20/2016      Reactions   Penicillins Nausea Only   Has patient had a PCN reaction causing immediate rash, facial/tongue/throat swelling, SOB or lightheadedness with hypotension: unknown Has patient had a PCN reaction causing severe rash involving mucus membranes or skin necrosis: unknown Has patient had a PCN reaction that required hospitalization: unknown Has patient had a PCN reaction occurring within the last 10 years: no If all of the above answers are "NO", then may proceed with Cephalosporin use.      Medication List    STOP taking these medications   COUMADIN 2.5 MG tablet Generic drug:  warfarin   meclizine 25 MG tablet Commonly known as:  ANTIVERT   warfarin 1 MG tablet Commonly known as:  COUMADIN     TAKE these medications   amLODipine 10 MG tablet Commonly known as:  NORVASC Take 10 mg by mouth daily.   buPROPion 150 MG 12 hr tablet Commonly known as:  ZYBAN One tab po qam for 3 days,  then increase to one tab BID for 7 weeks What changed:  how much to take  how to take this  when to take this  additional instructions   enalapril 5 MG tablet Commonly known as:  VASOTEC Take 5 mg by mouth daily.   feeding supplement (ENSURE ENLIVE) Liqd Take 237 mLs by mouth 2 (two) times daily between meals.   ferrous sulfate 325 (65 FE) MG tablet Take 1 tablet by mouth daily.   ipratropium-albuterol  0.5-2.5 (3) MG/3ML Soln Commonly known as:  DUONEB Take 3 mLs by nebulization every 6 (six) hours as needed.   LORazepam 1 MG tablet Commonly known as:  ATIVAN Take 1 tablet (1 mg total) by mouth at bedtime. What changed:  when to take this  reasons to take this   potassium chloride SA 20 MEQ tablet Commonly known as:  K-DUR,KLOR-CON Take 1 tablet (20 mEq total) by mouth 2 (two) times daily.   pravastatin 40 MG tablet Commonly known as:  PRAVACHOL Take 40 mg by mouth daily.      Allergies  Allergen Reactions  . Penicillins Nausea Only    Has patient had a PCN reaction causing immediate rash, facial/tongue/throat swelling, SOB or lightheadedness with hypotension: unknown Has patient had a PCN reaction causing severe rash involving mucus membranes or skin necrosis: unknown Has patient had a PCN reaction that required hospitalization: unknown Has patient had a PCN reaction occurring within the last 10 years: no If all of the above answers are "NO", then may proceed with Cephalosporin use.       The results of significant diagnostics from this hospitalization (including imaging, microbiology, ancillary and laboratory) are listed below for reference.    Significant Diagnostic Studies: Dg Chest 1 View  Result Date: 06/10/2016 CLINICAL DATA:  Acute mental status change. EXAM: CHEST 1 VIEW COMPARISON:  September 28, 2009 FINDINGS: There is a focal infiltrate in the left base which demonstrates a somewhat rounded lateral margin. No other interval changes or acute abnormalities. IMPRESSION: Somewhat rounded focal infiltrate in the left base medially. If the patient has signs of infection, recommend treatment with short-term follow-up. If the patient does not have signs for infection, recommend CT imaging. Electronically Signed   By: Dorise Bullion III M.D   On: 06/10/2016 15:11   Ct Head Wo Contrast  Result Date: 06/10/2016 CLINICAL DATA:  71 year old with four-day history of  generalized weakness, vomiting, dizziness and anorexia with multiple falls. Patient lives alone but has not been able to care for himself over the past 4 days. EXAM: CT HEAD WITHOUT CONTRAST TECHNIQUE: Contiguous axial images were obtained from the base of the skull through the vertex without intravenous contrast. COMPARISON:  MRI brain 06/06/2016, 05/31/2008. CT head 06/06/2016, 08/27/2010, 05/29/2008. FINDINGS: Brain: Severe cortical atrophy, moderate deep atrophy and severe cerebellar atrophy, progressive since 2012. Encephalomalacia involving the left posterior frontal and superior temporal lobes at the site of the prior stroke, unchanged since 2012. Severe changes of small vessel disease of the white matter diffusely, progressive since 2012. No mass lesion. No midline shift. No acute hemorrhage or hematoma. No extra-axial fluid collections. No evidence of acute infarction. Vascular: Severe bilateral carotid siphon and mild left vertebral artery atherosclerosis. Skull: No skull fracture or other focal osseous abnormality involving the skull. Sinuses/Orbits: Visualized paranasal sinuses, bilateral mastoid air cells and bilateral middle ear cavities well-aerated. Visualized orbits normal. Other: None. IMPRESSION: 1. No acute intracranial abnormality. 2. Severe generalized atrophy and severe chronic microvascular ischemic changes of  the white matter diffusely, progressive since 2012. 3. 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 Electronically Signed   By: Evangeline Dakin M.D.   On: 06/10/2016 15:38   Ct Head Wo Contrast  Result Date: 06/06/2016 CLINICAL DATA:  Dizziness, nausea, vomiting for past 4-5 days. EXAM: CT HEAD WITHOUT CONTRAST TECHNIQUE: Contiguous axial images were obtained from the base of the skull through the vertex without intravenous contrast. COMPARISON:  08/27/2010 FINDINGS: Brain: Old left infarct in the region of the insular cortex with encephalomalacia. There is  atrophy and chronic small vessel disease changes. No acute intracranial abnormality. Specifically, no hemorrhage, hydrocephalus, mass lesion, acute infarction, or significant intracranial injury. Vascular: No hyperdense vessel or unexpected calcification. Skull: No acute calvarial abnormality. Sinuses/Orbits: Visualized paranasal sinuses and mastoids clear. Orbital soft tissues unremarkable. Other: None IMPRESSION: Old left MCA infarct. Atrophy, chronic small vessel disease. No acute intracranial abnormality. Electronically Signed   By: Rolm Baptise M.D.   On: 06/06/2016 12:02   Ct Chest W Contrast  Result Date: 06/11/2016 CLINICAL DATA:  Abnormal CT abdomen pelvis, left lower lobe mass. Weakness and vomiting and dizziness. EXAM: CT CHEST WITH CONTRAST TECHNIQUE: Multidetector CT imaging of the chest was performed during intravenous contrast administration. CONTRAST:  36m ISOVUE-300 IOPAMIDOL (ISOVUE-300) INJECTION 61% COMPARISON:  CT abdomen pelvis 06/10/2016. FINDINGS: Cardiovascular: Atherosclerotic calcification of the arterial vasculature, including three-vessel involvement of the coronary arteries. Heart size normal. No pericardial effusion. Mediastinum/Nodes: No pathologically enlarged mediastinal, right hilar or axillary lymph nodes. Probable left hilar adenopathy, difficult to measure due to continuity with a large left lower lobe mass. Esophagus is grossly unremarkable. Lungs/Pleura: Heterogeneous left lower lobe mass measures 8.8 x 9.7 cm and crosses the left major fissure. It obstructs the left lower lobe bronchi with mild postobstructive pneumonitis in the peripheral left lower lobe. Associated trace left pleural effusion with pleural thickening and extrapleural lymph nodes measuring up to 5 mm (image 81). Trace right pleural effusion. Centrilobular emphysema. Debris seen dependently in the trachea. Upper Abdomen: Blush of hyper attenuation is seen in the dome of the liver (series 2, image 132),  possibly due to a perfusion anomaly, as it is not seen on 06/10/2016. Subcentimeter low-attenuation lesions in the liver are too small to characterize. Visualized portions of the liver, gallbladder, adrenal glands, kidneys, spleen, pancreas, stomach and bowel are otherwise grossly unremarkable. No upper abdominal adenopathy. Musculoskeletal: No worrisome lytic or sclerotic lesions. IMPRESSION: 1. Large left lower lobe mass crosses the left major fissure and is highly worrisome for primary bronchogenic carcinoma. Tiny left pleural effusion with suspected pleural thickening, worrisome but not definitive for malignant involvement. 2. Trace right pleural effusion. 3. Aortic atherosclerosis (ICD10-170.0). Three-vessel coronary artery calcification. 4.  Emphysema (ICD10-J43.9). Electronically Signed   By: MLorin PicketM.D.   On: 06/11/2016 10:19   Mr Brain Wo Contrast  Result Date: 06/06/2016 CLINICAL DATA:  Vertigo.  High risk for CVA. EXAM: MRI HEAD WITHOUT CONTRAST TECHNIQUE: Multiplanar, multiecho pulse sequences of the brain and surrounding structures were obtained without intravenous contrast. COMPARISON:  Brain MRI 05/31/2008.  Head CT from earlier today FINDINGS: Brain: No acute infarction, hemorrhage, hydrocephalus, extra-axial collection or mass lesion. Remote left MCA territory infarct affecting the insula and inferior frontal lobe. Deep white matter tracts were affected and there is wallerian degeneration seen in the cortical spinal tracts on the left. There is chronic sulcal widening at the vertex in the biparietal region with small areas of cortically based gliosis likely from  previous infarcts. Chronic microvascular disease with confluent ischemic gliosis in the cerebral white matter. Prominent atrophy of the bilateral cerebellum, also seen in 2009. Hemosiderin staining along the remote left MCA territory infarct. No generalized chronic hemorrhagic foci. Vascular: Preserved flow voids. Skull and upper  cervical spine: Negative Sinuses/Orbits: Negative. No mastoid or middle ear fluid. Grossly symmetric labyrinthine signal. IMPRESSION: 1. No acute finding, including infarct. 2. Extensive chronic microvascular disease and remote left MCA branch infarct. 3. Cerebellar and biparietal predominant atrophy. Electronically Signed   By: Monte Fantasia M.D.   On: 06/06/2016 14:26   Ct Abdomen Pelvis W Contrast  Result Date: 06/10/2016 CLINICAL DATA:  71 year old male with history of weakness and vomiting. Dizziness. EXAM: CT ABDOMEN AND PELVIS WITH CONTRAST TECHNIQUE: Multidetector CT imaging of the abdomen and pelvis was performed using the standard protocol following bolus administration of intravenous contrast. CONTRAST:  45m ISOVUE-300 IOPAMIDOL (ISOVUE-300) INJECTION 61% COMPARISON:  CT the abdomen pelvis 04/24/2004. FINDINGS: Lower chest: Large mass centered in the left lower lobe incompletely visualized but measuring at least 9.2 x 7.3 cm (image 1 of series 6) highly concerning for primary bronchogenic carcinoma. Trace left pleural effusion, likely malignant. Atherosclerotic calcifications in the left circumflex and right coronary arteries. Hepatobiliary: A few tiny subcentimeter low-attenuation lesions are noted in the liver, too small to definitively characterize. No large suspicious hepatic lesions are noted. No intra or extrahepatic biliary ductal dilatation. Gallbladder is unremarkable in appearance. Pancreas: No pancreatic mass. No pancreatic ductal dilatation. No pancreatic or peripancreatic fluid or inflammatory changes. Spleen: Calcified granulomas in the spleen. Adrenals/Urinary Tract: 13 mm low-attenuation lesion in the lower pole of the left kidney is compatible with a simple cyst. Right kidney and bilateral adrenal glands are normal in appearance. No hydroureteronephrosis. Urinary bladder is normal in appearance. Stomach/Bowel: The appearance of the stomach is normal. There is no pathologic  dilatation of small bowel or colon. Long segment thickening of the colonic wall involving predominantly the descending colon, concerning for colitis. Vascular/Lymphatic: Aortic atherosclerosis, without evidence of aneurysm or dissection. 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 No lymphadenopathy noted in the abdomen or pelvis. Reproductive: Prostate gland and seminal vesicles are unremarkable in appearance. Other: No significant volume of ascites.  No pneumoperitoneum. Musculoskeletal: There are no aggressive appearing lytic or blastic lesions noted in the visualized portions of the skeleton. IMPRESSION: 1. Large incompletely visualized mass centered in the left lower lobe highly concerning for primary bronchogenic neoplasm. Trace left pleural effusion is likely malignant, suggesting M1a disease. Further evaluation with contrast enhanced chest CT is recommended at this time to better evaluate the full extent of thoracic disease. 2. No definite evidence of extra thoracic metastatic disease in the abdomen or pelvis. 3. There are tiny subcentimeter low-attenuation lesions in the liver which are too small to definitively characterize. Metastatic lesions are not entirely excluded, but are not strongly favored. 4. Long segment colonic wall thickening of the descending colon, concerning for colitis. 5. Aortic atherosclerosis, in addition to at least 2 vessel coronary artery disease. Assessment for potential risk factor modification, dietary therapy or pharmacologic therapy may be warranted, if clinically indicated. 6. Additional incidental findings, as above. Electronically Signed   By: DVinnie LangtonM.D.   On: 06/10/2016 15:47   Dg Shoulder Right Port  Result Date: 06/13/2016 CLINICAL DATA:  Recurrent falls for the past month. 30 pound weight  loss with loss of appetite.  Right shoulder pain. EXAM: PORTABLE RIGHT SHOULDER COMPARISON:  None. FINDINGS: Old fracture deformity of the right clavicle. No evidence of acute fracture or dislocation in the right shoulder. No focal bone lesion or bone destruction. Bone cortex appears intact. Soft tissues are unremarkable. IMPRESSION: No acute bony abnormalities. Electronically Signed   By: Lucienne Capers M.D.   On: 06/13/2016 21:20    Microbiology: Recent Results (from the past 240 hour(s))  Blood culture (routine x 2)     Status: None   Collection Time: 06/10/16  2:16 PM  Result Value Ref Range Status   Specimen Description BLOOD LEFT ANTECUBITAL  Final   Special Requests BOTTLES DRAWN AEROBIC AND ANAEROBIC 8CC  Final   Culture NO GROWTH 7 DAYS  Final   Report Status 06/17/2016 FINAL  Final  Blood culture (routine x 2)     Status: None   Collection Time: 06/10/16  2:24 PM  Result Value Ref Range Status   Specimen Description BLOOD  Final   Special Requests NONE  Final   Culture NO GROWTH 7 DAYS  Final   Report Status 06/17/2016 FINAL  Final  MRSA PCR Screening     Status: None   Collection Time: 06/10/16  5:17 PM  Result Value Ref Range Status   MRSA by PCR NEGATIVE NEGATIVE Final    Comment:        The GeneXpert MRSA Assay (FDA approved for NASAL specimens only), is one component of a comprehensive MRSA colonization surveillance program. It is not intended to diagnose MRSA infection nor to guide or monitor treatment for MRSA infections.      Labs: Basic Metabolic Panel:  Recent Labs Lab 06/14/16 0411 06/17/16 2979 06/18/16 0453 06/19/16 0604 06/20/16 0544  NA 135 132* 133* 132* 133*  K 3.6 4.2 4.2 4.1 4.5  CL 103 94* 97* 97* 96*  CO2 '28 30 30 30 30  '$ GLUCOSE 87 91 94 96 99  BUN '10 13 14 15 14  '$ CREATININE 0.87 0.90 0.88 0.91 0.82  CALCIUM 8.6* 9.3 9.3 9.0 9.5   Liver Function Tests: No results for input(s): AST, ALT, ALKPHOS, BILITOT, PROT, ALBUMIN in the last 168 hours. No results for  input(s): LIPASE, AMYLASE in the last 168 hours. No results for input(s): AMMONIA in the last 168 hours. CBC:  Recent Labs Lab 06/14/16 0411 06/18/16 0453  WBC 10.5 18.3*  NEUTROABS 8.5* 15.4*  HGB 10.3* 10.6*  HCT 31.6* 32.5*  MCV 81.9 81.5  PLT 277 280   Cardiac Enzymes: No results for input(s): CKTOTAL, CKMB, CKMBINDEX, TROPONINI in the last 168 hours. BNP: BNP (last 3 results) No results for input(s): BNP in the last 8760 hours.  ProBNP (last 3 results) No results for input(s): PROBNP in the last 8760 hours.  CBG:  Recent Labs Lab 06/18/16 2130 06/19/16 1153 06/19/16 1646 06/19/16 1952 06/20/16 0728  GLUCAP 97 104* 95 108* 92       Signed:  [facility] Pager: [phone] 06/20/2016, 9:28 AM

## 2016-06-20 NOTE — Progress Notes (Signed)
Bed bath given and sheets changed. Call light within reach with bed alarm activated and in lowest position

## 2016-06-20 NOTE — Progress Notes (Signed)
IV removed, WNL. Pt. D/C instructions given to pt and family member. Pt family member is taking them to [facility].

## 2016-06-20 NOTE — Progress Notes (Signed)
Pt in bed asleep. Side rails up with bed alram. Call light within reach. Nothing needed at this time.

## 2016-06-28 ENCOUNTER — Ambulatory Visit (HOSPITAL_COMMUNITY): Payer: Medicare Other | Admitting: Oncology

## 2016-07-05 DEATH — deceased

## 2017-07-21 IMAGING — CT CT CHEST W/ CM
2 of 3 series · 15 of 36 positions shown, 18 images · IV contrast (iopamidol)
Comparison: CT abdomen pelvis 06/10/2016.

CLINICAL DATA: Abnormal CT abdomen pelvis, left lower lobe mass.
Weakness and vomiting and dizziness.

EXAM:
CT CHEST WITH CONTRAST
TECHNIQUE: Multidetector CT imaging of the chest was performed during
intravenous contrast administration.
CONTRAST:  75mL 6VUHZ2-WJJ IOPAMIDOL (6VUHZ2-WJJ) INJECTION 61%

[Series 2: axial st · axial · 0.71mm/px · z∈[+1342,+1618]mm · 12 of 164 slices shown, 15 images]
[im 13/164  mediastinal]
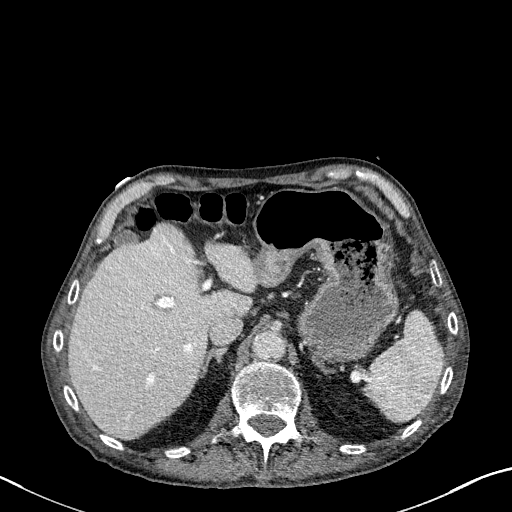
[im 13/164  lung]
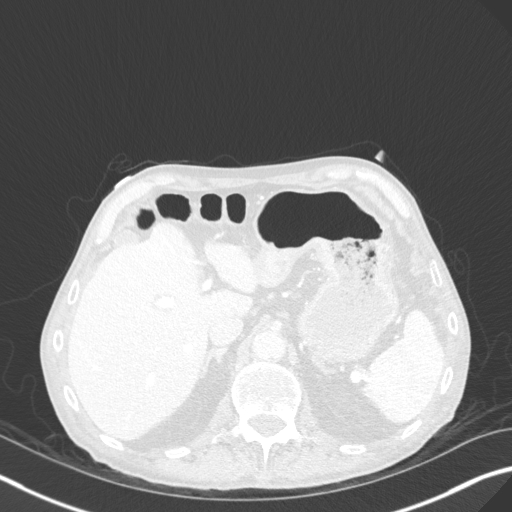
[im 25/164  lung]
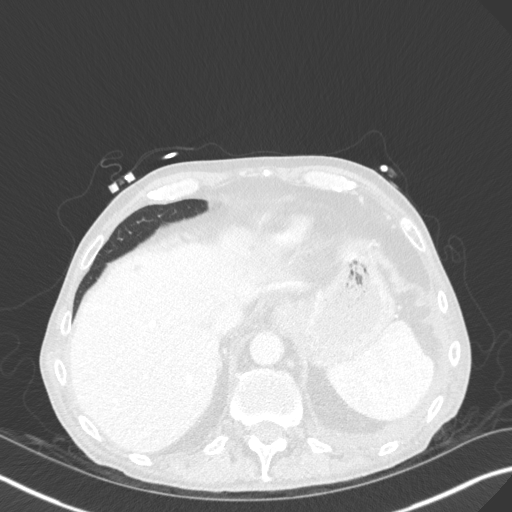
[im 37/164  lung]
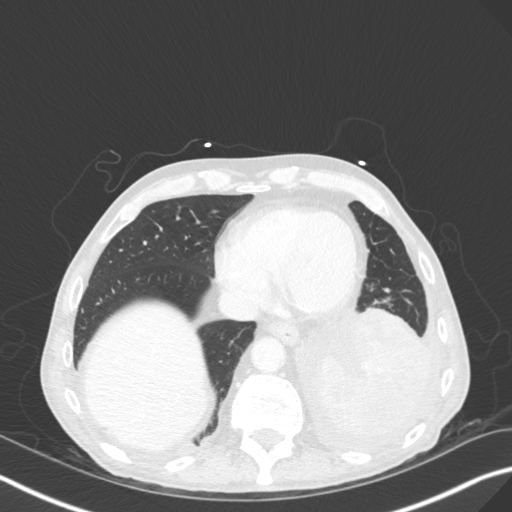
[im 49/164  lung]
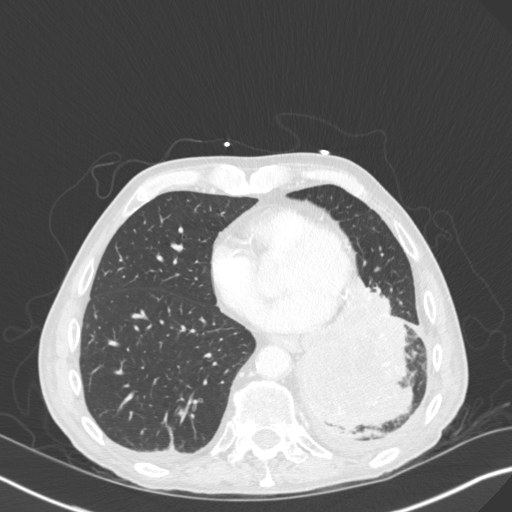
[im 61/164  mediastinal]
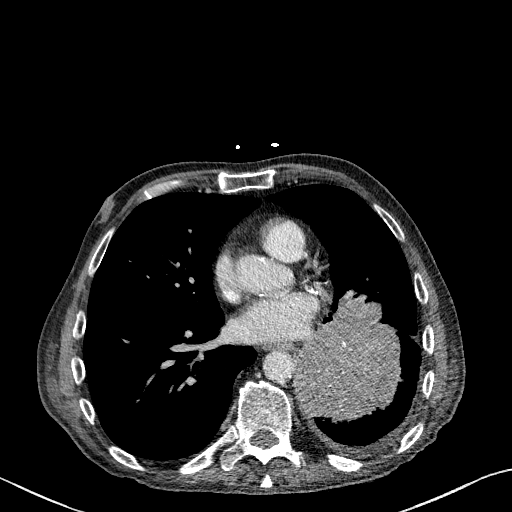
[im 61/164  lung]
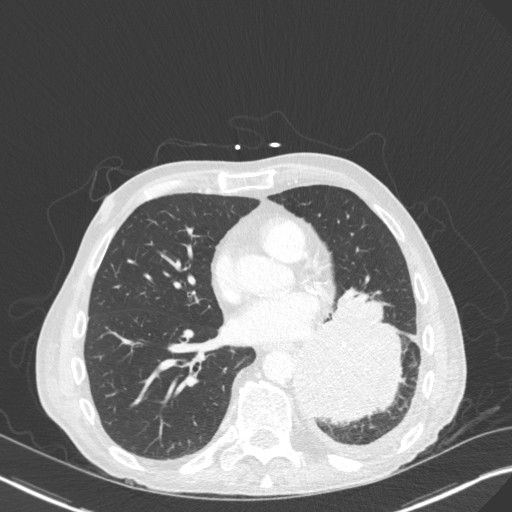
[im 73/164  lung]
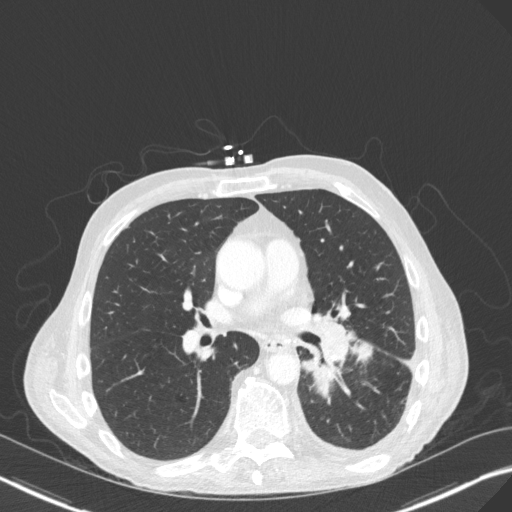
[im 91/164  lung]
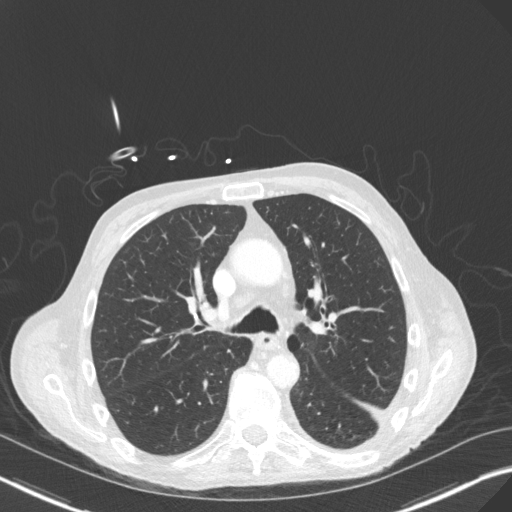
[im 103/164  lung]
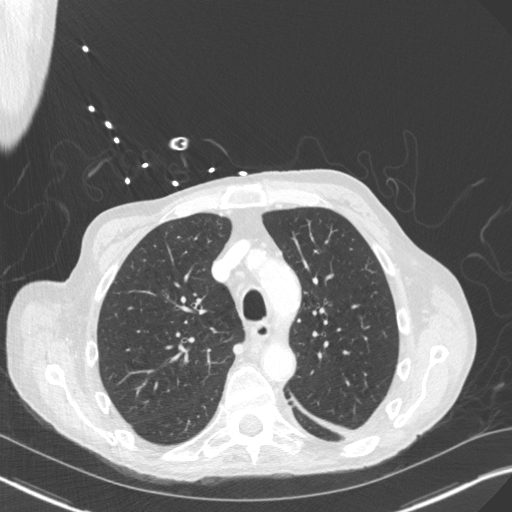
[im 115/164  mediastinal]
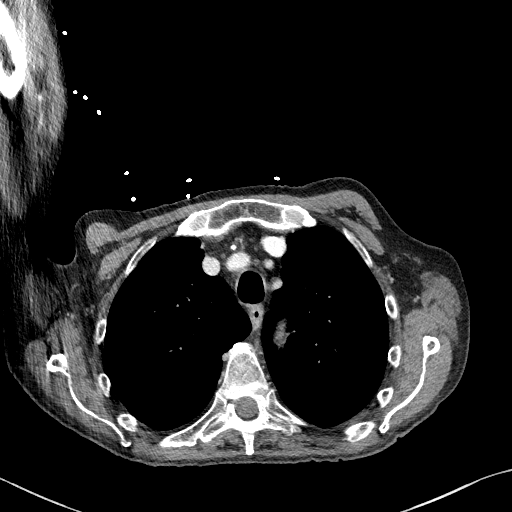
[im 115/164  lung]
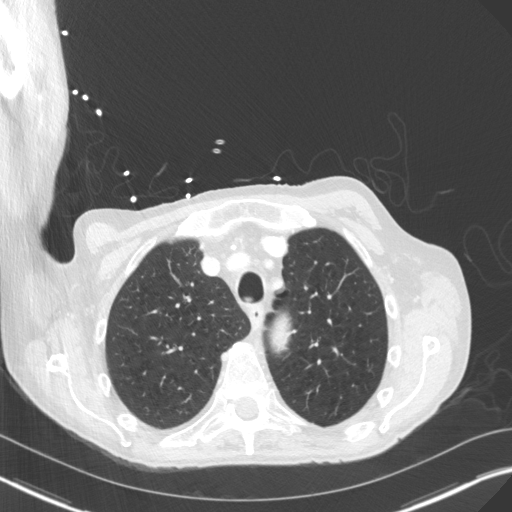
[im 127/164  lung]
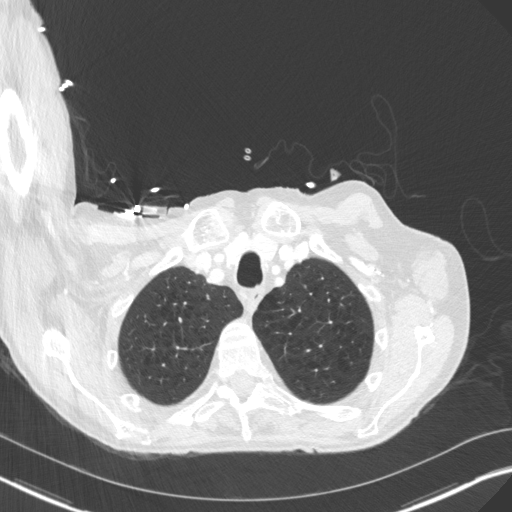
[im 139/164  lung]
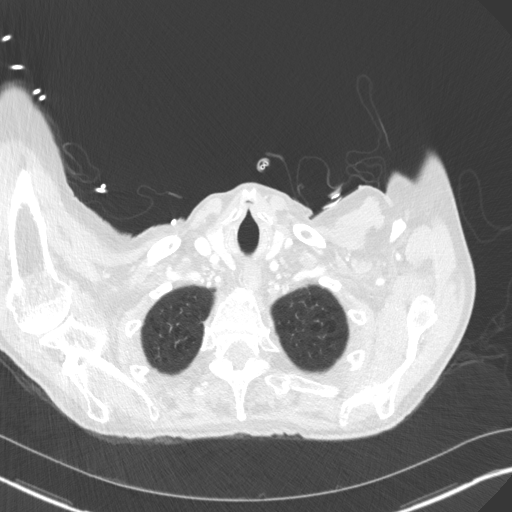
[im 151/164  lung]
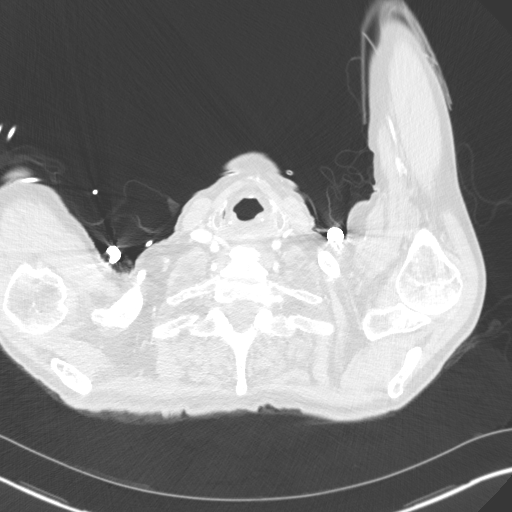

[Series 5: coronal · coronal · 0.61mm/px · 3 of 110 slices shown]
[im 22/110  lung]
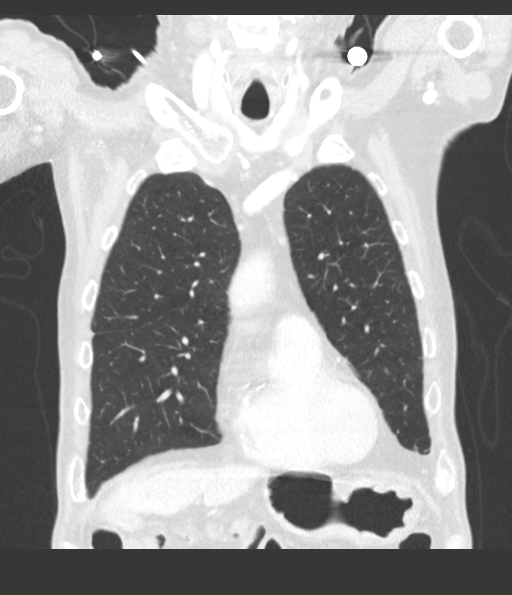
[im 44/110  lung]
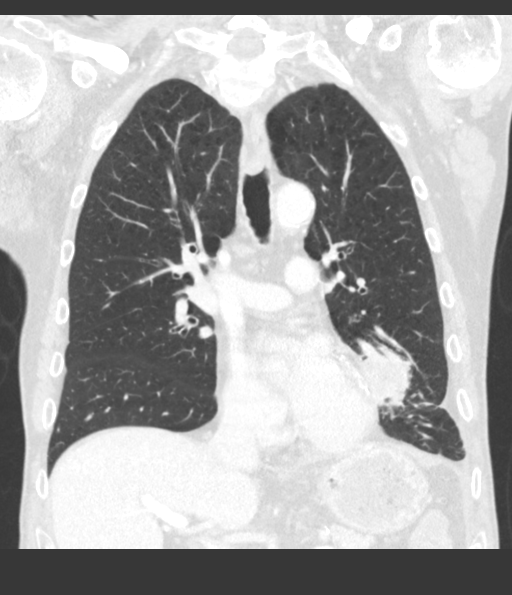
[im 66/110  lung]
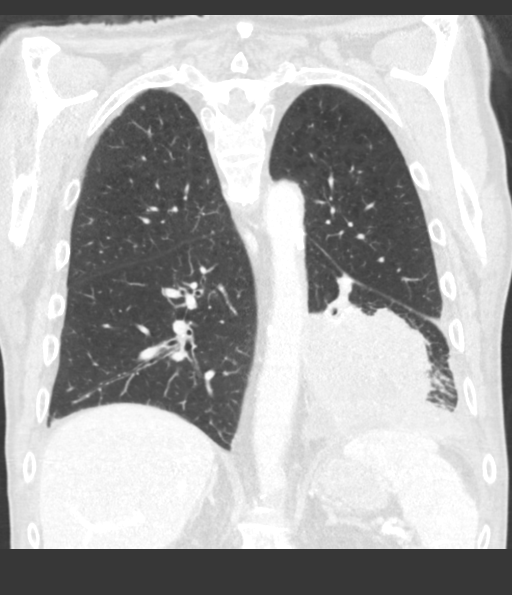

[15 of 36 positions shown; findings below may reference images not displayed]

FINDINGS: Cardiovascular: Atherosclerotic calcification of the arterial
vasculature, including three-vessel involvement of the coronary
arteries. Heart size normal. No pericardial effusion.

Mediastinum/Nodes: No pathologically enlarged mediastinal, right
hilar or axillary lymph nodes. Probable left hilar adenopathy,
difficult to measure due to continuity with a large left lower lobe
mass. Esophagus is grossly unremarkable.

Lungs/Pleura: Heterogeneous left lower lobe mass measures 8.8 x
cm and crosses the left major fissure. It obstructs the left lower
lobe bronchi with mild postobstructive pneumonitis in the peripheral
left lower lobe. Associated trace left pleural effusion with pleural
thickening and extrapleural lymph nodes measuring up to 5 mm (image
81). Trace right pleural effusion. Centrilobular emphysema. Debris
seen dependently in the trachea.

Upper Abdomen: Blush of hyper attenuation is seen in the dome of the
liver (series 2, image 132), possibly due to a perfusion anomaly, as
it is not seen on 06/10/2016. Subcentimeter low-attenuation lesions
in the liver are too small to characterize. Visualized portions of
the liver, gallbladder, adrenal glands, kidneys, spleen, pancreas,
stomach and bowel are otherwise grossly unremarkable. No upper
abdominal adenopathy.

Musculoskeletal: No worrisome lytic or sclerotic lesions.
IMPRESSION: 1. Large left lower lobe mass crosses the left major fissure and is
highly worrisome for primary bronchogenic carcinoma. Tiny left
pleural effusion with suspected pleural thickening, worrisome but
not definitive for malignant involvement.
2. Trace right pleural effusion.
3. Aortic atherosclerosis (55TJ3-170.0). Three-vessel coronary
artery calcification.
4.  Emphysema (55TJ3-HC2.M).
